# Patient Record
Sex: Female | Born: 1950 | Race: White | Hispanic: No | Smoking: Current every day smoker
Health system: Southern US, Community
[De-identification: ages and names within clinical notes are randomized; demographics above are authoritative.]

## PROBLEM LIST (undated history)

## (undated) DIAGNOSIS — F0280 Dementia in other diseases classified elsewhere without behavioral disturbance: Secondary | ICD-10-CM

## (undated) DIAGNOSIS — G3183 Dementia with Lewy bodies: Secondary | ICD-10-CM

## (undated) DIAGNOSIS — F32A Depression, unspecified: Secondary | ICD-10-CM

## (undated) DIAGNOSIS — K219 Gastro-esophageal reflux disease without esophagitis: Secondary | ICD-10-CM

## (undated) DIAGNOSIS — I517 Cardiomegaly: Secondary | ICD-10-CM

## (undated) DIAGNOSIS — J449 Chronic obstructive pulmonary disease, unspecified: Secondary | ICD-10-CM

## (undated) DIAGNOSIS — F329 Major depressive disorder, single episode, unspecified: Secondary | ICD-10-CM

## (undated) DIAGNOSIS — F319 Bipolar disorder, unspecified: Secondary | ICD-10-CM

## (undated) DIAGNOSIS — F419 Anxiety disorder, unspecified: Secondary | ICD-10-CM

## (undated) DIAGNOSIS — D696 Thrombocytopenia, unspecified: Secondary | ICD-10-CM

## (undated) DIAGNOSIS — M797 Fibromyalgia: Secondary | ICD-10-CM

## (undated) DIAGNOSIS — D649 Anemia, unspecified: Secondary | ICD-10-CM

## (undated) DIAGNOSIS — E039 Hypothyroidism, unspecified: Secondary | ICD-10-CM

## (undated) DIAGNOSIS — D509 Iron deficiency anemia, unspecified: Secondary | ICD-10-CM

## (undated) DIAGNOSIS — I509 Heart failure, unspecified: Secondary | ICD-10-CM

## (undated) HISTORY — DX: Thrombocytopenia, unspecified: D69.6

## (undated) HISTORY — DX: Iron deficiency anemia, unspecified: D50.9

## (undated) HISTORY — DX: Anxiety disorder, unspecified: F41.9

## (undated) HISTORY — DX: Bipolar disorder, unspecified: F31.9

## (undated) HISTORY — DX: Cardiomegaly: I51.7

## (undated) HISTORY — PX: CHOLECYSTECTOMY: SHX55

## (undated) HISTORY — DX: Hypothyroidism, unspecified: E03.9

## (undated) HISTORY — PX: EXPLORATORY LAPAROTOMY: SUR591

## (undated) HISTORY — DX: Gastro-esophageal reflux disease without esophagitis: K21.9

## (undated) HISTORY — PX: ABDOMINAL HYSTERECTOMY: SHX81

---

## 2001-09-18 ENCOUNTER — Emergency Department (HOSPITAL_COMMUNITY): Admission: EM | Admit: 2001-09-18 | Discharge: 2001-09-18 | Payer: Self-pay | Admitting: *Deleted

## 2001-09-18 ENCOUNTER — Encounter: Payer: Self-pay | Admitting: *Deleted

## 2002-05-28 ENCOUNTER — Emergency Department (HOSPITAL_COMMUNITY): Admission: EM | Admit: 2002-05-28 | Discharge: 2002-05-28 | Payer: Self-pay | Admitting: Internal Medicine

## 2002-05-28 ENCOUNTER — Encounter: Payer: Self-pay | Admitting: Internal Medicine

## 2003-04-23 ENCOUNTER — Emergency Department (HOSPITAL_COMMUNITY): Admission: EM | Admit: 2003-04-23 | Discharge: 2003-04-23 | Payer: Self-pay | Admitting: Internal Medicine

## 2004-02-28 ENCOUNTER — Other Ambulatory Visit: Payer: Self-pay

## 2004-10-13 ENCOUNTER — Emergency Department (HOSPITAL_COMMUNITY): Admission: EM | Admit: 2004-10-13 | Discharge: 2004-10-13 | Payer: Self-pay | Admitting: Emergency Medicine

## 2005-01-27 ENCOUNTER — Ambulatory Visit (HOSPITAL_COMMUNITY): Admission: RE | Admit: 2005-01-27 | Discharge: 2005-01-27 | Payer: Self-pay | Admitting: Preventative Medicine

## 2006-06-08 ENCOUNTER — Ambulatory Visit: Payer: Self-pay | Admitting: Gastroenterology

## 2006-07-07 ENCOUNTER — Ambulatory Visit: Payer: Self-pay | Admitting: Internal Medicine

## 2007-06-28 ENCOUNTER — Ambulatory Visit (HOSPITAL_COMMUNITY): Admission: RE | Admit: 2007-06-28 | Discharge: 2007-06-28 | Payer: Self-pay | Admitting: Family Medicine

## 2007-11-02 ENCOUNTER — Ambulatory Visit (HOSPITAL_COMMUNITY): Admission: RE | Admit: 2007-11-02 | Discharge: 2007-11-02 | Payer: Self-pay | Admitting: Family Medicine

## 2007-11-12 ENCOUNTER — Ambulatory Visit: Payer: Self-pay | Admitting: Cardiology

## 2007-11-12 ENCOUNTER — Ambulatory Visit (HOSPITAL_COMMUNITY): Admission: RE | Admit: 2007-11-12 | Discharge: 2007-11-12 | Payer: Self-pay | Admitting: Family Medicine

## 2007-11-29 ENCOUNTER — Ambulatory Visit (HOSPITAL_COMMUNITY): Admission: RE | Admit: 2007-11-29 | Discharge: 2007-11-29 | Payer: Self-pay | Admitting: Pulmonary Disease

## 2007-12-03 ENCOUNTER — Emergency Department (HOSPITAL_COMMUNITY): Admission: EM | Admit: 2007-12-03 | Discharge: 2007-12-03 | Payer: Self-pay | Admitting: Emergency Medicine

## 2007-12-08 ENCOUNTER — Ambulatory Visit (HOSPITAL_COMMUNITY): Admission: RE | Admit: 2007-12-08 | Discharge: 2007-12-08 | Payer: Self-pay | Admitting: Pulmonary Disease

## 2007-12-20 ENCOUNTER — Ambulatory Visit (HOSPITAL_COMMUNITY): Admission: RE | Admit: 2007-12-20 | Discharge: 2007-12-20 | Payer: Self-pay | Admitting: Family Medicine

## 2008-05-05 ENCOUNTER — Ambulatory Visit (HOSPITAL_COMMUNITY): Admission: RE | Admit: 2008-05-05 | Discharge: 2008-05-05 | Payer: Self-pay | Admitting: Pulmonary Disease

## 2008-07-03 ENCOUNTER — Ambulatory Visit (HOSPITAL_COMMUNITY): Admission: RE | Admit: 2008-07-03 | Discharge: 2008-07-03 | Payer: Self-pay | Admitting: Family Medicine

## 2008-12-28 ENCOUNTER — Observation Stay (HOSPITAL_COMMUNITY): Admission: EM | Admit: 2008-12-28 | Discharge: 2008-12-30 | Payer: Self-pay | Admitting: Emergency Medicine

## 2008-12-29 ENCOUNTER — Encounter (INDEPENDENT_AMBULATORY_CARE_PROVIDER_SITE_OTHER): Payer: Self-pay | Admitting: Cardiology

## 2008-12-30 HISTORY — PX: CARDIAC CATHETERIZATION: SHX172

## 2009-03-17 ENCOUNTER — Encounter: Payer: Self-pay | Admitting: Orthopedic Surgery

## 2009-03-17 ENCOUNTER — Ambulatory Visit (HOSPITAL_COMMUNITY): Admission: RE | Admit: 2009-03-17 | Discharge: 2009-03-17 | Payer: Self-pay | Admitting: Emergency Medicine

## 2009-05-02 ENCOUNTER — Ambulatory Visit (HOSPITAL_COMMUNITY): Admission: RE | Admit: 2009-05-02 | Discharge: 2009-05-02 | Payer: Self-pay | Admitting: Pulmonary Disease

## 2009-07-04 ENCOUNTER — Ambulatory Visit: Payer: Self-pay | Admitting: Orthopedic Surgery

## 2009-07-04 DIAGNOSIS — Z8679 Personal history of other diseases of the circulatory system: Secondary | ICD-10-CM | POA: Insufficient documentation

## 2009-07-04 DIAGNOSIS — M1710 Unilateral primary osteoarthritis, unspecified knee: Secondary | ICD-10-CM

## 2009-07-04 DIAGNOSIS — M25569 Pain in unspecified knee: Secondary | ICD-10-CM

## 2009-07-25 ENCOUNTER — Ambulatory Visit (HOSPITAL_COMMUNITY): Admission: RE | Admit: 2009-07-25 | Discharge: 2009-07-25 | Payer: Self-pay | Admitting: Pulmonary Disease

## 2009-10-22 ENCOUNTER — Emergency Department (HOSPITAL_COMMUNITY): Admission: EM | Admit: 2009-10-22 | Discharge: 2009-10-22 | Payer: Self-pay | Admitting: Emergency Medicine

## 2009-10-29 ENCOUNTER — Ambulatory Visit (HOSPITAL_COMMUNITY): Admission: RE | Admit: 2009-10-29 | Discharge: 2009-10-29 | Payer: Self-pay | Admitting: Family Medicine

## 2010-01-01 ENCOUNTER — Emergency Department (HOSPITAL_COMMUNITY): Admission: EM | Admit: 2010-01-01 | Discharge: 2010-01-01 | Payer: Self-pay | Admitting: Emergency Medicine

## 2010-06-05 ENCOUNTER — Encounter (HOSPITAL_COMMUNITY): Admission: RE | Admit: 2010-06-05 | Discharge: 2010-07-05 | Payer: Self-pay | Admitting: Oncology

## 2010-06-05 ENCOUNTER — Ambulatory Visit (HOSPITAL_COMMUNITY): Payer: Self-pay | Admitting: Oncology

## 2010-07-24 ENCOUNTER — Ambulatory Visit (HOSPITAL_COMMUNITY): Payer: Self-pay | Admitting: Oncology

## 2010-12-01 ENCOUNTER — Encounter: Payer: Self-pay | Admitting: Family Medicine

## 2010-12-02 ENCOUNTER — Encounter: Payer: Self-pay | Admitting: Pulmonary Disease

## 2011-01-21 ENCOUNTER — Other Ambulatory Visit (HOSPITAL_COMMUNITY): Payer: Self-pay

## 2011-01-21 ENCOUNTER — Encounter (HOSPITAL_COMMUNITY): Payer: Self-pay

## 2011-01-24 ENCOUNTER — Ambulatory Visit (HOSPITAL_COMMUNITY): Payer: Self-pay | Admitting: Oncology

## 2011-01-25 LAB — DIFFERENTIAL
Basophils Relative: 1 % (ref 0–1)
Eosinophils Absolute: 0.2 10*3/uL (ref 0.0–0.7)
Eosinophils Relative: 3 % (ref 0–5)
Lymphocytes Relative: 28 % (ref 12–46)
Lymphs Abs: 1.7 10*3/uL (ref 0.7–4.0)
Monocytes Absolute: 0.5 10*3/uL (ref 0.1–1.0)
Monocytes Relative: 8 % (ref 3–12)

## 2011-01-25 LAB — CBC
MCV: 89.2 fL (ref 78.0–100.0)
Platelets: 175 10*3/uL (ref 150–400)
RBC: 3.95 MIL/uL (ref 3.87–5.11)
RDW: 13 % (ref 11.5–15.5)
WBC: 6.2 10*3/uL (ref 4.0–10.5)

## 2011-02-11 LAB — COMPREHENSIVE METABOLIC PANEL
ALT: 20 U/L (ref 0–35)
Alkaline Phosphatase: 86 U/L (ref 39–117)
BUN: 18 mg/dL (ref 6–23)
CO2: 26 mEq/L (ref 19–32)
Calcium: 9 mg/dL (ref 8.4–10.5)
GFR calc non Af Amer: >60 mL/min (ref >60)
Glucose, Bld: 89 mg/dL (ref 70–99)
Potassium: 4.1 mEq/L (ref 3.5–5.1)
Sodium: 139 mEq/L (ref 135–145)

## 2011-02-11 LAB — DIFFERENTIAL
Basophils Relative: 1 % (ref 0–1)
Eosinophils Absolute: 0.1 10*3/uL (ref 0.0–0.7)
Neutro Abs: 4.5 10*3/uL (ref 1.7–7.7)
Neutrophils Relative %: 71 % (ref 43–77)

## 2011-02-11 LAB — URINALYSIS, ROUTINE W REFLEX MICROSCOPIC
Bilirubin Urine: NEGATIVE
Hgb urine dipstick: NEGATIVE
Nitrite: NEGATIVE
Protein, ur: NEGATIVE mg/dL
Specific Gravity, Urine: 1.01 (ref 1.005–1.030)
Urobilinogen, UA: 0.2 mg/dL (ref 0.0–1.0)

## 2011-02-11 LAB — CBC
HCT: 36.1 % (ref 36.0–46.0)
Hemoglobin: 12.3 g/dL (ref 12.0–15.0)
MCHC: 34.2 g/dL (ref 30.0–36.0)
MCV: 91.9 fL (ref 78.0–100.0)
Platelets: 184 10*3/uL (ref 150–400)
RBC: 3.93 MIL/uL (ref 3.87–5.11)
RDW: 13 % (ref 11.5–15.5)
WBC: 6.4 10*3/uL (ref 4.0–10.5)

## 2011-02-11 LAB — LIPASE, BLOOD: Lipase: 15 U/L (ref 11–59)

## 2011-02-14 LAB — BLOOD GAS, ARTERIAL
FIO2: 21 %
O2 Content: 21 L/min
O2 Saturation: 97.8 %

## 2011-02-25 LAB — LIPID PANEL
Cholesterol: 189 mg/dL (ref 0–200)
LDL Cholesterol: 134 mg/dL — ABNORMAL HIGH (ref 0–99)

## 2011-02-25 LAB — CARDIAC PANEL(CRET KIN+CKTOT+MB+TROPI)
CK, MB: 1.1 ng/mL (ref 0.3–4.0)
Relative Index: INVALID (ref 0.0–2.5)
Troponin I: <0.01 ng/mL (ref 0.00–0.06)

## 2011-02-25 LAB — COMPREHENSIVE METABOLIC PANEL
ALT: 31 U/L (ref 0–35)
AST: 23 U/L (ref 0–37)
Albumin: 3.8 g/dL (ref 3.5–5.2)
Chloride: 99 mEq/L (ref 96–112)
Creatinine, Ser: 0.67 mg/dL (ref 0.4–1.2)
GFR calc Af Amer: >60 mL/min (ref >60)
Potassium: 4 mEq/L (ref 3.5–5.1)
Sodium: 133 mEq/L — ABNORMAL LOW (ref 135–145)
Total Bilirubin: 0.4 mg/dL (ref 0.3–1.2)

## 2011-02-25 LAB — DIFFERENTIAL
Basophils Absolute: 0 10*3/uL (ref 0.0–0.1)
Eosinophils Relative: 0 % (ref 0–5)
Lymphocytes Relative: 16 % (ref 12–46)
Lymphs Abs: 1.4 10*3/uL (ref 0.7–4.0)
Monocytes Absolute: 0.3 10*3/uL (ref 0.1–1.0)

## 2011-02-25 LAB — CBC
HCT: 37.3 % (ref 36.0–46.0)
HCT: 38.9 % (ref 36.0–46.0)
Hemoglobin: 13.8 g/dL (ref 12.0–15.0)
MCV: 92.2 fL (ref 78.0–100.0)
Platelets: 171 10*3/uL (ref 150–400)
Platelets: 173 10*3/uL (ref 150–400)
RBC: 4.31 MIL/uL (ref 3.87–5.11)
RDW: 13.5 % (ref 11.5–15.5)
WBC: 7.6 10*3/uL (ref 4.0–10.5)
WBC: 8.8 10*3/uL (ref 4.0–10.5)

## 2011-02-25 LAB — BASIC METABOLIC PANEL
BUN: 14 mg/dL (ref 6–23)
CO2: 30 mEq/L (ref 19–32)
Chloride: 102 mEq/L (ref 96–112)
Chloride: 103 mEq/L (ref 96–112)
GFR calc Af Amer: >60 mL/min (ref >60)
Glucose, Bld: 106 mg/dL — ABNORMAL HIGH (ref 70–99)
Potassium: 4.3 mEq/L (ref 3.5–5.1)
Sodium: 140 mEq/L (ref 135–145)

## 2011-02-25 LAB — PROTIME-INR: INR: 1 (ref 0.00–1.49)

## 2011-02-25 LAB — ANA: Anti Nuclear Antibody(ANA): NEGATIVE

## 2011-02-25 LAB — RAPID URINE DRUG SCREEN, HOSP PERFORMED: Barbiturates: NOT DETECTED

## 2011-02-25 LAB — PTH, INTACT AND CALCIUM: PTH: 35.7 pg/mL (ref 14.0–72.0)

## 2011-02-25 LAB — CK TOTAL AND CKMB (NOT AT ARMC): CK, MB: 0.9 ng/mL (ref 0.3–4.0)

## 2011-02-25 LAB — APTT: aPTT: 31 seconds (ref 24–37)

## 2011-03-06 ENCOUNTER — Encounter (HOSPITAL_COMMUNITY): Payer: Self-pay | Attending: Oncology

## 2011-03-06 ENCOUNTER — Other Ambulatory Visit (HOSPITAL_COMMUNITY): Payer: Self-pay | Admitting: Oncology

## 2011-03-06 DIAGNOSIS — Z79899 Other long term (current) drug therapy: Secondary | ICD-10-CM | POA: Insufficient documentation

## 2011-03-06 DIAGNOSIS — D649 Anemia, unspecified: Secondary | ICD-10-CM

## 2011-03-06 DIAGNOSIS — I10 Essential (primary) hypertension: Secondary | ICD-10-CM | POA: Insufficient documentation

## 2011-03-06 DIAGNOSIS — D696 Thrombocytopenia, unspecified: Secondary | ICD-10-CM | POA: Insufficient documentation

## 2011-03-06 DIAGNOSIS — IMO0001 Reserved for inherently not codable concepts without codable children: Secondary | ICD-10-CM | POA: Insufficient documentation

## 2011-03-17 ENCOUNTER — Other Ambulatory Visit (HOSPITAL_COMMUNITY): Payer: Self-pay

## 2011-03-18 ENCOUNTER — Other Ambulatory Visit (HOSPITAL_COMMUNITY): Payer: Self-pay | Admitting: Oncology

## 2011-03-18 ENCOUNTER — Encounter (HOSPITAL_COMMUNITY): Payer: BC Managed Care – PPO | Attending: Oncology

## 2011-03-18 DIAGNOSIS — Z79899 Other long term (current) drug therapy: Secondary | ICD-10-CM | POA: Insufficient documentation

## 2011-03-18 DIAGNOSIS — I10 Essential (primary) hypertension: Secondary | ICD-10-CM | POA: Insufficient documentation

## 2011-03-18 DIAGNOSIS — D696 Thrombocytopenia, unspecified: Secondary | ICD-10-CM | POA: Insufficient documentation

## 2011-03-18 LAB — URINALYSIS, MICROSCOPIC ONLY
Glucose, UA: NEGATIVE mg/dL
Ketones, ur: NEGATIVE mg/dL
Leukocytes, UA: NEGATIVE
Specific Gravity, Urine: 1.025 (ref 1.005–1.030)
pH: 6.5 (ref 5.0–8.0)

## 2011-03-25 NOTE — Procedures (Signed)
NAME:  Lacey Gonzales, Lacey Gonzales              ACCOUNT NO.:  [id number]   MEDICAL RECORD NO.:  [id number]          PATIENT TYPE:  OUT   LOCATION:  RESP                          FACILITY:  [facility]   PHYSICIAN:  Edward L. Juanetta Gosling, M.D.DATE OF BIRTH:  12-05-1950   DATE OF PROCEDURE:  DATE OF DISCHARGE:  11/29/2007                            PULMONARY FUNCTION TEST   IMPRESSION:  1. Spirometry shows airflow obstruction at the level of the smaller      airways, but no ventilatory defect.  2. Lung volumes are normal.  3. DLCO was mildly reduced.  4. Arterial blood gases are normal.      Edward L. Juanetta Gosling, M.D.  Electronically Signed     ELH/MEDQ  D:  12/01/2007  T:  12/01/2007  Job:  [id number]

## 2011-03-25 NOTE — Procedures (Signed)
NAME:  GERA, INBODEN              ACCOUNT NO.:  [id number]   MEDICAL RECORD NO.:  [id number]          PATIENT TYPE:  OUT   LOCATION:  RAD                           FACILITY:  [facility]   PHYSICIAN:  Gerrit Friends. Dietrich Pates, MD, FACCDATE OF BIRTH:  May 25, 1951   DATE OF PROCEDURE:  11/12/2007  DATE OF DISCHARGE:                                ECHOCARDIOGRAM   REFERRING:  Assunta Found, MD   CLINICAL DATA:  A 60 year old woman with cardiomegaly and murmur;  history of hypertension.  M-mode:  Aorta 2.5, left atrium 3.6, septum  0.9, posterior wall 0.9, LV diastole 4.8, LV systole 3.7.   1. Technically adequate echocardiographic study.  2. Normal left atrium, right atrium and right ventricle.  3. Normal aortic valve and proximal ascending aorta.  4. Normal pulmonic valve and proximal pulmonary artery.  5. Normal tricuspid valve.  6. Delicate mitral valve with flat coaptation but no prolapse and no      regurgitation.  7. Normal internal dimension, wall thickness, regional and global      function of the left ventricle.  8. Normal IVC.      Gerrit Friends. Dietrich Pates, MD, [facility]  Electronically Signed     RMR/MEDQ  D:  11/12/2007  T:  11/12/2007  Job:  [id number]

## 2011-03-25 NOTE — Discharge Summary (Signed)
NAME:  Lacey Gonzales, Lacey Gonzales              ACCOUNT NO.:  [id number]   MEDICAL RECORD NO.:  [id number]          PATIENT TYPE:  INP   LOCATION:  3706                         FACILITY:  [facility]   PHYSICIAN:  Sheliah Mends, MD      DATE OF BIRTH:  November 02, 1951   DATE OF ADMISSION:  12/28/2008  DATE OF DISCHARGE:  12/30/2008                               DISCHARGE SUMMARY   DISCHARGE DIAGNOSES:  1. Near syncope, altered speech, unsteady gait, and dizziness,      possible effects from narcotic medications.  2. Acute-on-chronic sinusitis and mastoiditis.  3. Non-insulin-dependent diabetes mellitus.  4. Hypertension.  5. Gastroesophageal reflux disease.  6. Depression.  7. History of migraine headaches.  8. History of fibromyalgia.  9. History of osteoarthritis.  10.History of asthma.  11.Chest pain, noncardiac ischemic related, status post cardiac      catheterization, possible gastroesophageal reflux disease or      gastrointestinal related or musculoskeletal.   DISCHARGE MEDICATIONS:  1. Glipizide 10 mg daily.  2. Aspirin 325 mg daily.  3. Lisinopril 10 mg daily.  4. Nexium 40 mg twice a day.  5. Xanax 0.5 mg as needed p.r.n.  6. Ambien 12.5 mg at night.  7. Nasonex as needed.  8. Vicodin as needed.  9. Coreg 6.25 mg twice per day.  10.Amoxicillin 500 mg 2 times per day x7 days.  11.Lasix 20 mg daily, use only for swelling when needed.   DISCHARGE INSTRUCTIONS:  She is to do no strenuous activity, lifting,  pushing, pulling, or exercise for a week.  She was given a note not to  return to work until January 08, 2009.  If she has any problems with her  groin, she can give our office a call [phone].  She should follow up  with her primary care doctor.   LABORATORY DATA:  Total cholesterol 189, triglycerides 90, HDL 37, and  LDL 134.  Hemoglobin 13, hematocrit 37.3, WBC 7.6, and platelets 171.  ANA was negative.  Sodium 139, potassium 4.3, BUN 14, and creatinine  0.85.  BNP was less than 30.   TSH was 1.271.  UDS was positive for  benzodiazepines and positive for opiates.  CK-MBs and troponins were  negative x3.  PTH labs pending.   X-ray, MRA of her head with and without contrast revealed no acute  abnormality, chronic sinusitis and mastoiditis.  Vertebral arteries were  patent.  The internal carotid artery was patent.  No hemorrhage or mass  lesion and no acute infarct.  CT of her head showed no acute  intracranial abnormality, right mastoid effusion, and calvarial  osteopenia, questionable metabolic bone disease such as  hyperparathyroidism.   HOSPITAL COURSE:  Lacey Gonzales was seen in our [place] office on  December 28, 2008, by Dr. Sheliah Mends.  She had been sent over from  her primary care physician to office of Dr. Assunta Found with complaints  of chest pain, lightheadedness, near syncope, and dizziness.  She also  complained about slurred speech and facial droop.  She was advised to  come to [facility].  She  did not want to go by ambulance.  She  went home first and had someone bring her to the emergency room later on  in the evening.  She underwent assessment for CVA which was negative.  She was noted to have chronic sinusitis.  She was put on antibiotics for  mastoiditis.  PTH level was also drawn because of questionable metabolic  bone disease.  The test came back negative for any stroke, mass, or  lesions.  It was decided that she should undergo cardiac catheterization  because of her chest pain.  She had normal coronaries, normal LV  function, no MR.  She had normal aortoiliac runoff.  On December 30, 2008, she was considered stable for discharge home.  Medications were  adjusted while she was in the hospital.  She should follow up with her  primary care doctor for further treatment. She may require adjustment of  pain and anxiolytic medications which may have contributed to her  symptoms.       Lezlie Octave, N.P.      Sheliah Mends, MD   Electronically Signed    BB/MEDQ  D:  12/30/2008  T:  12/31/2008  Job:  [id number]   cc:   Corrie Mckusick, M.D.

## 2011-03-25 NOTE — Cardiovascular Report (Signed)
NAME:  Lacey Gonzales, STELLA NO.:  [id number]   MEDICAL RECORD NO.:  [id number]          PATIENT TYPE:  INP   LOCATION:  3706                         FACILITY:  [facility]   PHYSICIAN:  Sheliah Mends, MD      DATE OF BIRTH:  January 09, 1951   DATE OF PROCEDURE:  DATE OF DISCHARGE:                            CARDIAC CATHETERIZATION   CARDIOLOGISTS:  Sheliah Mends, MD and Nicki Guadalajara, MD   INDICATIONS:  Ms. Arrellano is a 60 year old lady who presented to  [facility] with multiple complaints  including shortness of breath, exertional chest pain, palpitations, and  significant lower extremity edema.  In addition, she reported  neurological symptoms including an unsteady gait, dizziness,  lightheadedness, near syncope, and slurred speech.  She was initially  evaluated with an MRI/MRA of head and neck without evidence of a prior  neurological event.  In addition, a CT of her head was negative for  evidence of CVA.  Subsequently, she was brought to the cardiac  catheterization laboratory for further evaluation of her coronary  status.  Ms. Bump has multiple risk factors including hypertension,  tobacco dependence, and obesity.   PROCEDURE:  After informed consent was obtained, the patient was started  on conscious sedation using Versed and fentanyl.  In addition, local  anesthesia was applied using 1% lidocaine.  In sterile fashion, using  the modified Seldinger technique, access to the right femoral artery was  obtained and a 5-French arterial sheath was placed.  Subsequently,  selective right and left coronary angiography, left ventriculography,  and aortic runoff was performed using a Judkins 5-French left #4 and  right #4 catheter as well as a pigtail catheter.   FINDINGS:  1. Left main coronary artery.  The left main coronary artery is a      short vessel that trifurcates into the LAD, ramus intermedius, and      left circumflex artery.  The left  main artery is without      angiographically significant disease.  2. Left descending coronary artery disease.  The LAD is a large vessel      wrapping around the apex.  It gives off a large, branching diagonal      artery.  The LAD has no angiographically significant disease.  3. Ramus intermedius.  The ramus intermedius is a large vessel without      angiographically significant disease.  4. Left circumflex artery.  The left circumflex artery is a medium-      sized vessel which gives 1 obtuse marginal branch off.  The left      circumflex artery is without significant disease.  5. Right coronary artery.  The right coronary artery is a medium-sized      vessel which is dominant.  It gives off the PDA.  There is no      hemodynamically significant disease seen in the RCA.  6. The distal aortic runoff showed normal renal arteries, normal      caliber, and no evidence of significant disease in the iliac      arteries.   HEMODYNAMICS:  Left ventricular pressure 159/15 mmHg.  Aortic pressure  165/84 mmHg.  There is no significant aortic gradient measured.   Left ventriculogram.  The left ventriculogram shows normal left  ventricular function.  There is no evidence of significant mitral  regurgitation.   SUMMARY:  1. Normal coronary arteries without evidence of angiographically      significant coronary artery disease.  2. Normal left ventricular function.  3. No evidence of a significant aortic gradient.  4. Normal aortic runoff without evidence of peripheral vascular      disease.   RECOMMENDATIONS:  Ms. Streiff's chest pain is of noncardiac nature.  She  has no evidence of significant coronary artery disease.  She was quite  hypertensive during her cardiac catheterization and should be followed  with appropriate risk factor management including smoking cessation and  treatment of her hypertension.   The procedure was completed without complication.  Dr. Nicki Guadalajara  proctored  the entire case.      Sheliah Mends, MD  Electronically Signed     JE/MEDQ  D:  12/29/2008  T:  12/30/2008  Job:  [id number]

## 2011-03-25 NOTE — H&P (Signed)
NAME:  Lacey Gonzales, Lacey Gonzales NO.:  [id number]   MEDICAL RECORD NO.:  [id number]          PATIENT TYPE:  INP   LOCATION:  3706                         FACILITY:  [facility]   PHYSICIAN:  Sheliah Mends, MD      DATE OF BIRTH:  July 24, 1951   DATE OF ADMISSION:  12/28/2008  DATE OF DISCHARGE:                              HISTORY & PHYSICAL   IDENTIFICATION:  A 60 year old lady with slurred speech, facial droop,  chest pain, and bilateral edema.   HISTORY OF PRESENT ILLNESS:  Ms. Lacey Gonzales is a 60 year old lady  who was referred by Dr. Assunta Found for a cardiac evaluation secondary  to lower extremity edema and chest pain. Ms. Lacey Gonzales describes  significant decline of her functional status over the last 2 weeks.  She  had multiple episodes of lightheadedness, dizziness, and near-syncope.  In fact, she fell down twice at her place of employment.  In addition,  she noted a significant weight gain and lower extremities edema.  She  has difficulties to sit and to put her shoes on.  Ms. Lacey Gonzales further  describes episodes of chest pain and palpitation associated with nausea  and shortness of breath.  Furthermore, she describes episodes of slurred  speech.  In fact, she was told by several people that they were unable  to understand her.  On further questioning, Ms. Lacey Gonzales admits to mild  right facial droop.  She denies any focal neurological deficits  elsewhere.  The onset of his symptoms was fairly sudden, sometime 2  weeks ago, and has progressively worsened.   Ms. Lacey Gonzales has no history of coronary artery disease or prior stroke.  Her risk factors for coronary artery disease include lifelong history of  tobacco use.  Her lipid profile is unknown.  She has no history of type  2 diabetes mellitus.  Ms. Lacey Gonzales denies any illicit drug use, any recent  fevers, productive cough, diarrhea, or constipation.  She did notice  some skin changes with marble discoloration of her skin on legs  and  arms.  Her medical history is further complicated by history of migraine  headaches, asthma, fibromyalgia, and osteoarthritis.   PAST MEDICAL HISTORY:  1. Hypertension.  2. GERD.  3. Depression.  4. Migraine headaches.  5. Asthma.  6. Fibromyalgia.  7. Osteoarthritis.   MEDICATIONS:  1. Nexium 40 mg p.o. daily.  2. Alprazolam 0.5 mg p.r.n.  3. Ambien CR 12.5 mg nightly.  4. Lasix 20 mg p.o. daily  5. Unknown nebulizers.  6. Hydrocodone p.r.n.   ALLERGIES:  SULFA.   SOCIAL HISTORY:  The patient is married and has 2 children.  She works  for a [employer] and is exposed to chemicals everyday.  She is unable  to remember any particular agents.  She has a 40-pack year history of  tobacco use.  She denies a history of alcohol and IV drug abuse.   FAMILY HISTORY:  The patient's mother is alive at age 35 and has a  history of hypertension.  The patient's father died of cancer at an  unknown age.  The  patient's brother died at age 66 of complications from  myocardial infarction.  The patient has 5 more siblings and all of them  have a history of hypertension.   REVIEW OF SYSTEMS:  Positive as above, otherwise negative.   PHYSICAL EXAMINATION:  GENERAL:  The patient is alert and oriented x3.  VITAL SIGNS:  Blood pressure 110/72, heart rate 104, weight 173.  NECK:  Supple.  Normal JVP.  No carotid bruit.  CHEST:  Lungs clear to auscultation bilaterally.  No rales or wheezes.  HEART:  Regular rate and rhythm.  No rub, murmur, or gallop.  ABDOMEN:  Soft, nontender, and nondistended.  Positive bowel sounds.  EXTREMITIES:  A 2+ edema on hands and feet.   EKG shows sinus tachycardia at a rate of 104 beats per minute, normal  axis, and no significant ST-T segment changes.   CBC and chemistry panel is within normal limits.  These were obtained on  December 22, 2008. A TSH is within normal limits.   IMPRESSION:  1. Chest pain of unknown etiology with multiple risk factors for       coronary artery disease.  2. Slurred speech and symptoms concerning for a cerebrovascular      accident.  3. Hypertension.  4. Tobacco dependence.  5. Asthma.  6. Unknown lipid status.   PLAN:  Lacey Gonzales presents with slurred speech and unsteady gait and  possibly with mild right facial droop, all concerning for a recent CVA  or related neurological process.  She will be admitted to [facility], and an MRI, MRA of head and neck will be obtained.  This  should give more insight into recent CVA as well as other neurological  disorders, such as, multiple sclerosis.  In addition, Lacey Gonzales will be  scheduled for a transthoracic echocardiogram to evaluate her left  ventricular function and for valve abnormalities.  She will be followed  with serial cardiac enzymes given her complaints of chest pain.  Given  her strong family history of coronary artery disease and complaints of  recurrent chest pain, she may be scheduled for cardiac catheterization  after a Neurology evaluation has been completed.  In addition, Ms.  Gonzales will be evaluated for presence of an autoimmune disorder giving  her the systemic character of her symptoms.   Lacey Gonzales was initially seen in the [place] office of the  [facility].  I asked Lacey Gonzales to agree to  transport via the EMS; however, she declined and against my advice drove  in her own car home.  She stated that she will meet her husband at home  who will transport her to [facility].   The patient will be admitted to telemetry as a full code.      Sheliah Mends, MD  Electronically Signed     JE/MEDQ  D:  12/28/2008  T:  12/29/2008  Job:  [id number]   cc:   Corrie Mckusick, M.D.

## 2011-04-17 ENCOUNTER — Other Ambulatory Visit (HOSPITAL_COMMUNITY): Payer: Self-pay | Admitting: Endocrinology

## 2011-04-17 DIAGNOSIS — E0500 Thyrotoxicosis with diffuse goiter without thyrotoxic crisis or storm: Secondary | ICD-10-CM

## 2011-04-22 ENCOUNTER — Encounter (HOSPITAL_COMMUNITY): Payer: Self-pay

## 2011-04-22 ENCOUNTER — Encounter (HOSPITAL_COMMUNITY)
Admission: RE | Admit: 2011-04-22 | Discharge: 2011-04-22 | Disposition: A | Discharge status: Home / Self Care | Payer: PRIVATE HEALTH INSURANCE | Source: Ambulatory Visit | Attending: Endocrinology | Admitting: Endocrinology

## 2011-04-22 DIAGNOSIS — E0500 Thyrotoxicosis with diffuse goiter without thyrotoxic crisis or storm: Secondary | ICD-10-CM | POA: Insufficient documentation

## 2011-04-22 HISTORY — DX: Heart failure, unspecified: I50.9

## 2011-04-22 MED ORDER — SODIUM IODIDE I 131 CAPSULE
10.0000 | Freq: Once | INTRAVENOUS | Status: AC | PRN
  Administered 2011-04-22: 15 via ORAL

## 2011-04-23 ENCOUNTER — Inpatient Hospital Stay (HOSPITAL_COMMUNITY): Admission: RE | Admit: 2011-04-23 | Payer: BC Managed Care – PPO | Source: Ambulatory Visit

## 2011-04-23 MED ORDER — SODIUM PERTECHNETATE TC 99M INJECTION: 10.0000 | Freq: Once | INTRAVENOUS | Status: AC | PRN

## 2011-04-30 ENCOUNTER — Ambulatory Visit (INDEPENDENT_AMBULATORY_CARE_PROVIDER_SITE_OTHER): Payer: Self-pay | Admitting: Internal Medicine

## 2011-04-30 DIAGNOSIS — Z86010 Personal history of colonic polyps: Secondary | ICD-10-CM

## 2011-04-30 DIAGNOSIS — D649 Anemia, unspecified: Secondary | ICD-10-CM

## 2011-04-30 DIAGNOSIS — Z800 Family history of malignant neoplasm of digestive organs: Secondary | ICD-10-CM

## 2011-05-07 ENCOUNTER — Ambulatory Visit (HOSPITAL_COMMUNITY): Payer: Self-pay | Admitting: Oncology

## 2011-05-08 ENCOUNTER — Other Ambulatory Visit (HOSPITAL_COMMUNITY): Payer: Self-pay | Admitting: Endocrinology

## 2011-05-08 DIAGNOSIS — E0500 Thyrotoxicosis with diffuse goiter without thyrotoxic crisis or storm: Secondary | ICD-10-CM

## 2011-05-12 ENCOUNTER — Encounter (HOSPITAL_COMMUNITY): Payer: Medicare Other | Attending: Oncology | Admitting: Oncology

## 2011-05-12 DIAGNOSIS — D649 Anemia, unspecified: Secondary | ICD-10-CM

## 2011-05-12 NOTE — Consult Note (Signed)
NAME:  Lacey Gonzales, Lacey Gonzales              ACCOUNT NO.:  [id number]  MEDICAL RECORD NO.:  [id number]  LOCATION:  RAD                           FACILITY:  [facility]  PHYSICIAN:  Lionel December, M.D.    DATE OF BIRTH:  Mar 10, 1951  DATE OF CONSULTATION:  04/30/2011 DATE OF DISCHARGE:                                  CONSULTATION   REASON FOR CONSULTATION:  Anemia and screening colonoscopy.  HISTORY OF PRESENT ILLNESS:  Lacey Gonzales is a 60 year old female referred to our office by Dr. Mariel Sleet for anemia.  Lacey Gonzales tells me she has a history of anemia for approximately 4 years.  On February 27, 2011, her H and H was 11.3 and 34.6, her MCV was 85.2, and platelets were slightly low at 127.  On September 05, 2010, her H and H was 12.2 and 37.8, her MCV was 94.8, and platelets were 209.  She tells me her appetite is good. She has had a weight loss of 19 pounds over the last 2 years.  She tells me she does have frequent constipation.  She usually has a bowel movement every 2 days, and she does use a stool softener on a p.r.n. basis.  Her stools are brown.  She denies any history of melena or bright red rectal bleeding.  Her last colonoscopy she tells me has been over 10 years ago.  She could not tell me where it was.  She says she was 60 years old and did have polyps.  She is allergic to SULFA.  PAST SURGICAL HISTORY:  She has had a cholecystectomy many years ago.  MEDICAL HISTORY:  Fibromyalgia for 4 years, manic depression x4 years, bipolar for 4 years, and she has a history of Parkinson's.  FAMILY HISTORY:  Her mother is in good health.  Her father is deceased from stomach cancer.  Two sisters, one has depression, one has coronary artery disease. She has   three brothers, one is deceased with a hx of colon cancer, but he actually committed suicide, and two are in good health.  She is widowed.  She is unemployed. She smokes one pack of cigarettes a day since age 87.  She has smoked up to 4 packs a day.   She does not drink or do drugs.  She has two children, one has diabetes type 1 and is bipolar and schizophrenic; and one has bipolar.  HOME MEDICATIONS: 1. Xanax 0.5 mg three times a day. 2. Hydrocodone 5/500 one a day. 3. Savella 50 mg a day. 4. Artane 2 mg three times a day. 5. Benzonatate 100 mg three times a day. 6. Meloxicam 15 mg a day. 7. Acid reducer 150 mg twice a day. 8. Coreg 25 mg twice a day. 9. Lasix 20 mg one a day. 10.Sertraline 50 mg a day. 11.Phenergan 25 mg as needed. 12.Ambien 10 mg at night. 13.Doxepin 6 mg a day. 14.Cough syrup p.r.n.  OBJECTIVE:  VITAL SIGNS:  Her weight is 161.5, her height is 5 feet, her temperature is 98, her blood pressure is 120/78, her pulse 80. HEENT:  She has natural teeth.  Her oral mucosa is moist.  Her conjunctivae are pink.  Her sclerae  are anicteric. NECK:  Her thyroid is normal.  There is no cervical lymphadenopathy. LUNGS:  Clear. HEART:  Regular rate and rhythm. ABDOMEN:  Soft.  Bowel sounds are positive.  No masses and no tenderness.  There is no edema to her lower extremities.  Her stool is guaiac negative.  ASSESSMENT:  Lacey Gonzales is a 60 year old female presenting with very mild anemia.  Her stool was guaiac negative. She also c/o frequent constipation. There is however a family history of colon cancer in a first-degree relative.  Her last colonoscopy was greater than 10 years ago.  Colonic neoplasm, arteriovenous malformation, and polyp needs to be ruled out.  RECOMMENDATIONS:  We will schedule a colonoscopy in the near future. The risk and benefits were reviewed with the patient, and she is agreeable.  I would also like for her to start MiraLax one-half to one scoop daily for her frequent constipation.    ______________________________ Dorene Ar, NP   ______________________________ Lionel December, M.D.    TS/MEDQ  D:  04/30/2011  T:  05/01/2011  Job:  [id number]  cc:   Ladona Horns. Mariel Sleet, MD Fax:  [phone]  Electronically Signed by Dorene Ar PA on 05/01/2011 04:58:06 PM Electronically Signed by Lionel December M.D. on 05/12/2011 12:31:21 AM

## 2011-05-21 ENCOUNTER — Encounter (HOSPITAL_COMMUNITY)
Admission: RE | Admit: 2011-05-21 | Discharge: 2011-05-21 | Disposition: A | Discharge status: Home / Self Care | Payer: Medicaid Other | Source: Ambulatory Visit | Attending: Endocrinology | Admitting: Endocrinology

## 2011-05-21 DIAGNOSIS — E0500 Thyrotoxicosis with diffuse goiter without thyrotoxic crisis or storm: Secondary | ICD-10-CM | POA: Insufficient documentation

## 2011-05-21 MED ORDER — SODIUM IODIDE I 131 CAPSULE
10.0000 | Freq: Once | INTRAVENOUS | Status: AC | PRN
  Administered 2011-05-21: 10 via ORAL

## 2011-06-09 MED ORDER — SODIUM CHLORIDE 0.45 % IV SOLN
Freq: Once | INTRAVENOUS | Status: AC
  Administered 2011-06-11: 1000 mL via INTRAVENOUS

## 2011-06-10 ENCOUNTER — Encounter (INDEPENDENT_AMBULATORY_CARE_PROVIDER_SITE_OTHER): Payer: Self-pay | Admitting: Internal Medicine

## 2011-06-10 MED ORDER — MIDAZOLAM HCL 5 MG/5ML IJ SOLN
INTRAMUSCULAR | Status: AC
  Filled 2011-06-10: qty 5

## 2011-06-10 MED ORDER — MEPERIDINE HCL 50 MG/ML IJ SOLN
INTRAMUSCULAR | Status: AC
  Filled 2011-06-10: qty 1

## 2011-06-11 ENCOUNTER — Encounter (HOSPITAL_COMMUNITY): Payer: Self-pay | Admitting: *Deleted

## 2011-06-11 ENCOUNTER — Ambulatory Visit (HOSPITAL_COMMUNITY)
Admission: RE | Admit: 2011-06-11 | Discharge: 2011-06-11 | Disposition: A | Discharge status: Home / Self Care | Payer: Medicaid Other | Source: Ambulatory Visit | Attending: Internal Medicine | Admitting: Internal Medicine

## 2011-06-11 ENCOUNTER — Encounter (HOSPITAL_COMMUNITY)
Admission: RE | Disposition: A | Discharge status: Home / Self Care | Payer: Self-pay | Source: Ambulatory Visit | Attending: Internal Medicine

## 2011-06-11 DIAGNOSIS — J449 Chronic obstructive pulmonary disease, unspecified: Secondary | ICD-10-CM | POA: Insufficient documentation

## 2011-06-11 DIAGNOSIS — G20 Parkinson's disease: Secondary | ICD-10-CM | POA: Insufficient documentation

## 2011-06-11 DIAGNOSIS — D509 Iron deficiency anemia, unspecified: Secondary | ICD-10-CM | POA: Insufficient documentation

## 2011-06-11 DIAGNOSIS — Z800 Family history of malignant neoplasm of digestive organs: Secondary | ICD-10-CM

## 2011-06-11 DIAGNOSIS — Z860100 Personal history of colon polyps, unspecified: Secondary | ICD-10-CM | POA: Insufficient documentation

## 2011-06-11 DIAGNOSIS — G20A1 Parkinson's disease without dyskinesia, without mention of fluctuations: Secondary | ICD-10-CM | POA: Insufficient documentation

## 2011-06-11 DIAGNOSIS — J4489 Other specified chronic obstructive pulmonary disease: Secondary | ICD-10-CM | POA: Insufficient documentation

## 2011-06-11 HISTORY — DX: Anemia, unspecified: D64.9

## 2011-06-11 HISTORY — DX: Major depressive disorder, single episode, unspecified: F32.9

## 2011-06-11 HISTORY — PX: COLONOSCOPY: SHX5424

## 2011-06-11 HISTORY — DX: Fibromyalgia: M79.7

## 2011-06-11 HISTORY — DX: Depression, unspecified: F32.A

## 2011-06-11 HISTORY — DX: Chronic obstructive pulmonary disease, unspecified: J44.9

## 2011-06-11 SURGERY — COLONOSCOPY
Anesthesia: Moderate Sedation

## 2011-06-11 MED ORDER — MEPERIDINE HCL 50 MG/ML IJ SOLN
INTRAMUSCULAR | Status: AC
  Filled 2011-06-11: qty 1

## 2011-06-11 MED ORDER — MIDAZOLAM HCL 5 MG/5ML IJ SOLN
INTRAMUSCULAR | Status: AC
  Filled 2011-06-11: qty 10

## 2011-06-11 MED ORDER — STERILE WATER FOR IRRIGATION IR SOLN
Status: DC | PRN
  Administered 2011-06-11: 15:00:00

## 2011-06-11 MED ORDER — MEPERIDINE HCL 50 MG/ML IJ SOLN
INTRAMUSCULAR | Status: DC | PRN
  Administered 2011-06-11 (×2): 25 mg via INTRAVENOUS

## 2011-06-11 MED ORDER — MIDAZOLAM HCL 5 MG/5ML IJ SOLN
INTRAMUSCULAR | Status: DC | PRN
  Administered 2011-06-11 (×5): 2 mg via INTRAVENOUS

## 2011-06-11 NOTE — Brief Op Note (Signed)
06/11/2011  4:04 PM  PATIENT:  Lacey Gonzales  60 y.o. female  PRE-OPERATIVE DIAGNOSIS:  Family History of Colon Cancer, history of Polyps  POST-OPERATIVE DIAGNOSIS:  * No post-op diagnosis entered *  PROCEDURE:  Procedure(s): COLONOSCOPY  SURGEON:  Surgeon(s): Malissa Hippo, MD  Colonoscopy note; Exam to cecum; Normal exam limited by the quality of prep was fair at best.

## 2011-06-11 NOTE — H&P (Signed)
Lacey Gonzales is an 60 y.o. female.   Chief Complaint: Patient is here for colonoscopy HPI: Patient is 60 year old Caucasian female who has history of colonic polyps. Her last exam was in [place] about 10 years ago. She was advised to come back in one year she elected not to. He has had mild anemia however she has not experienced melena rectal bleeding and in her stool has been heme negative.Marland Kitchen He denies abdominal pain anorexia or weight loss. Family history is positive for colon carcinoma her brother who had a PR at age 34 and killed himself he could not cope with this and  Past Medical History  Diagnosis Date  . CHF (congestive heart failure)   . Fibromyalgia   . Depression   . Parkinson's disease   . Anemia   . COPD (chronic obstructive pulmonary disease)     Past Surgical History  Procedure Date  . Abdominal hysterectomy   . Cholecystectomy   . Exploratory laparotomy     Family History  Problem Relation Age of Onset  . Cancer Father   . Depression Father   . Coronary artery disease Father    Social History:  reports that she has been smoking Cigarettes.  She has a 20 pack-year smoking history. She does not have any smokeless tobacco history on file. She reports that she does not drink alcohol or use illicit drugs.  Allergies:  Allergies  Allergen Reactions  . Sulfur     Medications Prior to Admission  Medication Dose Route Frequency Provider Last Rate Last Dose  . 0.45 % sodium chloride infusion   Intravenous Once Malissa Hippo, MD 20 mL/hr at 06/11/11 1447 1,000 mL at 06/11/11 1447  . meperidine (DEMEROL) 50 MG/ML injection           . midazolam (VERSED) 5 MG/5ML injection           . DISCONTD: meperidine (DEMEROL) 50 MG/ML injection           . DISCONTD: midazolam (VERSED) 5 MG/5ML injection            Medications Prior to Admission  Medication Sig Dispense Refill  . ALPRAZolam (XANAX) 0.5 MG tablet Take 0.5 mg by mouth 3 (three) times daily.        .  benzonatate (TESSALON) 100 MG capsule Take 100 mg by mouth 3 (three) times daily as needed.        . carvedilol (COREG) 25 MG tablet Take 25 mg by mouth 2 (two) times daily with a meal.        . doxepin (SINEQUAN) 10 MG capsule Take 10 mg by mouth 1 day or 1 dose.        . esomeprazole (NEXIUM) 40 MG capsule Take 40 mg by mouth 2 (two) times daily.        . furosemide (LASIX) 20 MG tablet Take 20 mg by mouth daily.        Marland Kitchen guaiFENesin-codeine (ROBITUSSIN AC) 100-10 MG/5ML syrup Take 5 mLs by mouth.        Marland Kitchen HYDROcodone-acetaminophen (VICODIN) 5-500 MG per tablet Take 1 tablet by mouth 1 day or 1 dose.        . meloxicam (MOBIC) 15 MG tablet Take 15 mg by mouth daily.        . promethazine (PHENERGAN) 25 MG tablet Take 25 mg by mouth every 6 (six) hours as needed.        . sertraline (ZOLOFT) 50 MG tablet Take 50  mg by mouth daily.        . trihexyphenidyl (ARTANE) 2 MG tablet Take 2 mg by mouth 3 (three) times daily with meals.        Marland Kitchen zolpidem (AMBIEN CR) 12.5 MG CR tablet Take 12.5 mg by mouth at bedtime as needed.          No results found for this or any previous visit (from the past 48 hour(s)). No results found.  Review of Systems  Gastrointestinal: Positive for constipation (good results with colace). Negative for heartburn, nausea, vomiting, abdominal pain, diarrhea, blood in stool and melena.    Blood pressure 149/82, pulse 90, temperature 98.7 F (37.1 C), temperature source Oral, resp. rate 22, height 5' (1.524 m), weight 161 lb (73.029 kg), SpO2 98.00%. Physical Exam   Assessment/Plan History of colonic polyps. Colonoscopy. Procedure and risks were reviewed with the patient and she is agreeable  [name] 06/11/2011, 3:21 PM

## 2011-06-12 NOTE — Op Note (Signed)
NAME:  [name], [name]              ACCOUNT NO.:  [id number]  MEDICAL RECORD NO.:  [id number]  LOCATION:                                 FACILITY:  PHYSICIAN:  Lionel December, M.D.    DATE OF BIRTH:  [birth year]  DATE OF PROCEDURE: DATE OF DISCHARGE:                              OPERATIVE REPORT   PROCEDURE:  Colonoscopy.  INDICATION:  Lacey Gonzales is 60 year old Caucasian female who has history of colonic polyps.  [place].  She had three polyps removed and she was advised to come back in a year, but she decided not to.  She has some mild anemia, but no evidence of GI bleed.  Family history is also significant for colon carcinoma in her brother who died at age of 108.  Procedures were reviewed with the patient.  Informed consent was obtained.  MEDS FOR CONSCIOUS SEDATION: 1. Demerol 50 mg IV. 2. Versed 10 mg IV.  FINDINGS:  Procedure performed in endoscopy suite.  The patient's vital signs and O2 sat were monitored during the procedure and remained stable.  The patient was placed in left lateral recumbent position. Rectal examination performed.  No abnormality noted on external or digital exam.  Pentax videoscope was placed through rectum and advanced under vision into sigmoid colon and beyond.  She had lot of thick liquid stool.  Nevertheless, scope was passed into cecum which was identified by ileocecal valve and appendiceal stump.  Pictures taken for the record.  As the scope was withdrawn, the colonic mucosa was examined as best as possible.  No polyps and tumor masses were noted.  Rectal mucosa similarly was normal.  Scope was retroflexed to examine anorectal junction which was unremarkable.  Endoscope was withdrawn.  Withdrawal time was 11 minutes.  The patient tolerated the procedure well.  FINAL DIAGNOSES: 1. Examination performed to cecum. 2. Normal examination, but limited by quality of prep.  RECOMMENDATIONS: 1.  Standard instructions given. 2. The patient should consider having next colonoscopy no later than 5     years, and she will need 2-day prep.          ______________________________ Lionel December, M.D.     NR/MEDQ  D:  06/11/2011  T:  06/12/2011  Job:  [id number]  cc:   Ladona Horns. Mariel Sleet, MD Fax: [phone]  Dr. Phillips Odor

## 2011-06-19 ENCOUNTER — Encounter (HOSPITAL_COMMUNITY): Payer: Self-pay | Admitting: Internal Medicine

## 2011-07-31 LAB — BLOOD GAS, ARTERIAL
Acid-Base Excess: 0.1
Bicarbonate: 24.5 — ABNORMAL HIGH
Bicarbonate: 25.1 — ABNORMAL HIGH
FIO2: 0.21
FIO2: 21
O2 Saturation: 97.2
O2 Saturation: 98.1
Patient temperature: 37
pH, Arterial: 7.42 — ABNORMAL HIGH
pO2, Arterial: 96

## 2011-07-31 LAB — DIFFERENTIAL
Basophils Absolute: 0.1
Basophils Relative: 1
Eosinophils Absolute: 0.1
Eosinophils Relative: 1
Neutrophils Relative %: 68

## 2011-07-31 LAB — URINALYSIS, ROUTINE W REFLEX MICROSCOPIC
Glucose, UA: NEGATIVE
Hgb urine dipstick: NEGATIVE
Protein, ur: NEGATIVE
Specific Gravity, Urine: 1.01
Urobilinogen, UA: 0.2

## 2011-07-31 LAB — COMPREHENSIVE METABOLIC PANEL
ALT: 30
Alkaline Phosphatase: 88
CO2: 28
Calcium: 9.3
Chloride: 105
GFR calc non Af Amer: >60
Glucose, Bld: 90
Sodium: 139
Total Bilirubin: 0.6

## 2011-07-31 LAB — PROTIME-INR
INR: 0.9
Prothrombin Time: 12.2

## 2011-07-31 LAB — CBC
Hemoglobin: 13.4
MCHC: 34.2
RBC: 4.42
WBC: 7.1

## 2011-07-31 LAB — APTT: aPTT: 28

## 2011-08-12 ENCOUNTER — Telehealth (HOSPITAL_COMMUNITY): Payer: Self-pay

## 2011-08-12 ENCOUNTER — Encounter (HOSPITAL_COMMUNITY): Payer: Medicare Other | Attending: Oncology | Admitting: Oncology

## 2011-08-12 ENCOUNTER — Encounter (HOSPITAL_BASED_OUTPATIENT_CLINIC_OR_DEPARTMENT_OTHER): Payer: Medicare Other

## 2011-08-12 VITALS — BP 137/93 | HR 89 | Temp 98.1°F | Wt 169.8 lb

## 2011-08-12 DIAGNOSIS — D649 Anemia, unspecified: Secondary | ICD-10-CM

## 2011-08-12 DIAGNOSIS — D509 Iron deficiency anemia, unspecified: Secondary | ICD-10-CM

## 2011-08-12 DIAGNOSIS — D696 Thrombocytopenia, unspecified: Secondary | ICD-10-CM

## 2011-08-12 DIAGNOSIS — N342 Other urethritis: Secondary | ICD-10-CM | POA: Insufficient documentation

## 2011-08-12 LAB — URINALYSIS, ROUTINE W REFLEX MICROSCOPIC
Bilirubin Urine: NEGATIVE
Glucose, UA: NEGATIVE mg/dL
Hgb urine dipstick: NEGATIVE
Ketones, ur: NEGATIVE mg/dL
Leukocytes, UA: NEGATIVE
Protein, ur: NEGATIVE mg/dL
pH: 7 (ref 5.0–8.0)

## 2011-08-12 LAB — CBC
MCH: 30.9 pg (ref 26.0–34.0)
MCHC: 34.8 g/dL (ref 30.0–36.0)
Platelets: 125 10*3/uL — ABNORMAL LOW (ref 150–400)
RDW: 12.3 % (ref 11.5–15.5)

## 2011-08-12 LAB — IRON AND TIBC
Iron: 71 ug/dL (ref 42–135)
TIBC: 382 ug/dL (ref 250–470)

## 2011-08-12 NOTE — Progress Notes (Signed)
This office note has been dictated.

## 2011-08-12 NOTE — Progress Notes (Signed)
Labs drawn today for cbc,fe,Ibc

## 2011-08-12 NOTE — Patient Instructions (Signed)
[facility]  Discharge Instructions  RECOMMENDATIONS MADE BY THE CONSULTANT AND ANY TEST RESULTS WILL BE SENT TO YOUR REFERRING DOCTOR.   SPECIAL INSTRUCTIONS/FOLLOW-UP: Lab work Needed  and Return to Clinic as scheduled at the front desk.  You will be called with the results of the urine test if they are abnormal.   I acknowledge that I have been informed and understand all the instructions given to me and received a copy. I do not have any more questions at this time, but understand that I may call the Specialty Clinic at [facility] at [phone] during business hours should I have any further questions or need assistance in obtaining follow-up care.    __________________________________________  _____________  __________ Signature of Patient or Authorized Representative            Date                   Time    __________________________________________ Nurse's Signature

## 2011-08-12 NOTE — Progress Notes (Signed)
CC:   Dorisann Frames, M.D. Corrie Mckusick, M.D.  DIAGNOSES: 1. Anemia which has now resolved. 2. Possible iron-deficiency on replacement Niferex and we will see     what her iron studies show. 3. Intermittent thrombocytopenia which is stable at 125,000 today. 4. Hyperthyroidism status post radioactive iodine ablation May 21, 2011 and she has not seen Dr. Talmage Nap in follow up.  I have     encouraged her to make that appointment when they get home.  She     has been 10 weeks without follow up for her TSH etc. 5. Urethritis symptomatology and a urinalysis is pending from today. 6. Bipolar disorder. 7. History of Parkinson's disease. 8. History of hysterectomy 1975. 9. Cholecystectomy in the past. 10.History of exploratory laparotomy in the past. 11.History of coronary artery disease with congestive heart failure in     the past.  [place] wanted to know if I could treat her thyroid disorder with thyroid replacement, but I informed that really Dr. Talmage Nap needs to be doing that.  She is going to make an appointment when she gets back.  She does not feel good.  She states that she is weaker and her daughter is with her today.  Her vital signs today showed a weight of 169 pounds that is different from 152 pounds from April.  Her blood pressure is 137/93 that corresponds with a blood pressure 124/80 from April.  Her pulse is right around 89, respirations 20 and unlabored at rest.  She is afebrile.  I did not examine her further since her hemoglobin has stayed in the normal range now.  We will see what her iron studies show and I would like to see her back in January after she gets squared away on her replacement thyroid medication.    ______________________________ Ladona Horns. Mariel Sleet, MD ESN/MEDQ  D:  08/12/2011  T:  08/12/2011  Job:  [id number]

## 2011-08-12 NOTE — Telephone Encounter (Signed)
Patient called to inform of lab results.  Patient verbalized understanding.

## 2011-08-13 LAB — URINE CULTURE
Colony Count: 90000
Culture  Setup Time: 201210021120

## 2011-09-16 ENCOUNTER — Encounter: Payer: Self-pay | Admitting: Oncology

## 2011-12-01 ENCOUNTER — Encounter (HOSPITAL_COMMUNITY): Payer: Medicare Other | Attending: Oncology

## 2011-12-01 DIAGNOSIS — D649 Anemia, unspecified: Secondary | ICD-10-CM

## 2011-12-01 DIAGNOSIS — N342 Other urethritis: Secondary | ICD-10-CM

## 2011-12-01 LAB — RETICULOCYTES
Retic Count, Absolute: 82.8 10*3/uL (ref 19.0–186.0)
Retic Ct Pct: 2.4 % (ref 0.4–3.1)

## 2011-12-01 LAB — IRON AND TIBC
Saturation Ratios: 13 % — ABNORMAL LOW (ref 20–55)
UIBC: 374 ug/dL (ref 125–400)

## 2011-12-01 LAB — CBC
MCV: 93 fL (ref 78.0–100.0)
Platelets: 145 10*3/uL — ABNORMAL LOW (ref 150–400)
RDW: 13.5 % (ref 11.5–15.5)
WBC: 7 10*3/uL (ref 4.0–10.5)

## 2011-12-01 NOTE — Progress Notes (Signed)
Labs drawn today for cbc,retic,Iron and IBC, ferr

## 2011-12-03 ENCOUNTER — Encounter (HOSPITAL_BASED_OUTPATIENT_CLINIC_OR_DEPARTMENT_OTHER): Payer: Medicare Other | Admitting: Oncology

## 2011-12-03 VITALS — BP 112/78 | HR 97 | Temp 97.4°F

## 2011-12-03 DIAGNOSIS — D509 Iron deficiency anemia, unspecified: Secondary | ICD-10-CM

## 2011-12-03 DIAGNOSIS — D649 Anemia, unspecified: Secondary | ICD-10-CM

## 2011-12-03 DIAGNOSIS — E611 Iron deficiency: Secondary | ICD-10-CM

## 2011-12-03 DIAGNOSIS — F319 Bipolar disorder, unspecified: Secondary | ICD-10-CM

## 2011-12-03 MED ORDER — SODIUM CHLORIDE 0.9 % IV SOLN: 1020.0000 mg | Freq: Once | INTRAVENOUS | Status: DC

## 2011-12-03 NOTE — Progress Notes (Signed)
This office note has been dictated.

## 2011-12-03 NOTE — Progress Notes (Signed)
CC:   Corrie Mckusick, M.D. Lionel December, M.D.  DIAGNOSIS: 1. Iron-deficiency anemia. 2. Hypothyroidism status post I 131 therapy. 3. Mild thrombocytopenia. 4. Bipolar disorder. 5. Fibromyalgia. 6. Cholecystectomy in the past. 7. Possible Parkinson disease. 8. History of coronary artery disease with CHF. 9. History of hypertension.   She still has a mild anemia and absolutely has iron-deficiency.  Oral iron did not replace her iron stores.  She just returned from Dr. Lara Mulch, she states, and her 2 good friends brought her today.  From my standpoint, I think the oral iron is not useful in continuing and I have offered her 1 dose of Feraheme heme 1020 mg, which we will give to her sometime in the next 1-2 weeks.  We will then check her iron stores about 6 weeks later and see where we stand but I think they should be better.  I think we also need to check her serum iron and TIBC since they have sometimes been more reliable than the ferritin.  So her hemoglobin has dropped from 12.5 in October to 11.0 the other day. Platelets are still mildly low at 145,000 and her white count remains unremarkable.  I hope that the iron that might help her feel a little bit better, but I think it is worth at least 1 trial of IV iron replacement to see if it does.  Will see her back after the labs in March.    ______________________________ Ladona Horns. Mariel Sleet, MD ESN/MEDQ  D:  12/03/2011  T:  12/03/2011  Job:  [id number]

## 2011-12-03 NOTE — Patient Instructions (Signed)
[facility]  Discharge Instructions  RECOMMENDATIONS MADE BY THE CONSULTANT AND ANY TEST RESULTS WILL BE SENT TO YOUR REFERRING DOCTOR.   EXAM FINDINGS BY MD TODAY AND SIGNS AND SYMPTOMS TO REPORT TO CLINIC OR PRIMARY MD: Your hemoglobin is not very low but your iron studies are low.  We will give you iron by IV on Feb 5th  SPECIAL INSTRUCTIONS/FOLLOW-UP: Other (Referral/Appointments) labs 6 weeks after iron infusion. Then to see our PA.   I acknowledge that I have been informed and understand all the instructions given to me and received a copy. I do not have any more questions at this time, but understand that I may call the Specialty Clinic at [facility] at [phone] during business hours should I have any further questions or need assistance in obtaining follow-up care.    __________________________________________  _____________  __________ Signature of Patient or Authorized Representative            Date                   Time    __________________________________________ Nurse's Signature

## 2011-12-16 ENCOUNTER — Ambulatory Visit (HOSPITAL_COMMUNITY): Payer: Medicare Other

## 2012-01-19 ENCOUNTER — Encounter (HOSPITAL_COMMUNITY): Payer: Medicare Other | Attending: Oncology

## 2012-01-19 DIAGNOSIS — E611 Iron deficiency: Secondary | ICD-10-CM

## 2012-01-19 DIAGNOSIS — D509 Iron deficiency anemia, unspecified: Secondary | ICD-10-CM | POA: Insufficient documentation

## 2012-01-19 DIAGNOSIS — D696 Thrombocytopenia, unspecified: Secondary | ICD-10-CM | POA: Insufficient documentation

## 2012-01-19 LAB — IRON AND TIBC
Iron: 60 ug/dL (ref 42–135)
UIBC: >575 ug/dL — ABNORMAL HIGH (ref 125–400)

## 2012-01-19 LAB — CBC
HCT: 36.2 % (ref 36.0–46.0)
Hemoglobin: 12.3 g/dL (ref 12.0–15.0)
MCH: 31.1 pg (ref 26.0–34.0)
RBC: 3.95 MIL/uL (ref 3.87–5.11)

## 2012-01-19 LAB — FERRITIN: Ferritin: 141 ng/mL (ref 10–291)

## 2012-01-19 NOTE — Progress Notes (Signed)
Labs drawn today for cbc,ferr,Iron and IBC 

## 2012-01-21 ENCOUNTER — Encounter (HOSPITAL_COMMUNITY): Payer: Self-pay | Admitting: Oncology

## 2012-01-21 ENCOUNTER — Encounter (HOSPITAL_BASED_OUTPATIENT_CLINIC_OR_DEPARTMENT_OTHER): Payer: Medicare Other | Admitting: Oncology

## 2012-01-21 VITALS — BP 123/86 | HR 98 | Temp 98.1°F | Wt 174.3 lb

## 2012-01-21 DIAGNOSIS — M797 Fibromyalgia: Secondary | ICD-10-CM

## 2012-01-21 DIAGNOSIS — D696 Thrombocytopenia, unspecified: Secondary | ICD-10-CM

## 2012-01-21 DIAGNOSIS — E039 Hypothyroidism, unspecified: Secondary | ICD-10-CM

## 2012-01-21 DIAGNOSIS — F319 Bipolar disorder, unspecified: Secondary | ICD-10-CM

## 2012-01-21 DIAGNOSIS — D509 Iron deficiency anemia, unspecified: Secondary | ICD-10-CM

## 2012-01-21 HISTORY — DX: Bipolar disorder, unspecified: F31.9

## 2012-01-21 HISTORY — DX: Hypothyroidism, unspecified: E03.9

## 2012-01-21 HISTORY — DX: Iron deficiency anemia, unspecified: D50.9

## 2012-01-21 HISTORY — DX: Thrombocytopenia, unspecified: D69.6

## 2012-01-21 HISTORY — DX: Fibromyalgia: M79.7

## 2012-01-21 NOTE — Progress Notes (Signed)
Colette Ribas, MD, MD [address] [place] [address]  1. Iron deficiency anemia  CBC, Differential, Vitamin B12, Folate, Iron and TIBC, Ferritin  2. Mild Thrombocytopenia  CBC, Vitamin B12, Folate, Iron and TIBC, Ferritin  3. Bipolar disorder    4. Hypothyroidism    5. Fibromyalgia      CURRENT THERAPY: Niferex daily.  INTERVAL HISTORY: Lacey Gonzales 61 y.o. female returns for  regular  visit for followup of iron deficiency anemia.     The patient was scheduled for an IV Feraheme infusion in February, but the patient was a no show.  Despite this, interestingly, her lab work recently shows a ferritin of 141 which is well within normal limits.  Her serum iron is solid at 60.  Her hemoglobin is within normal limits at 12.3 g/dL and a normal MCV.  Her platelets remain stable and mildly low at 132,000. I personally reviewed and went over laboratory results with the patient.  The patient reports many medical complaints that are not related her her anemia or platelets.  I have encouraged the patient to follow-up with her medical physician regarding her "knee pain," "fatigue," etc.  I reports that her hemoglobin is within normal limits so her fatigue is not related to her anemia and her present platelet count will not effect her energy level.  She understands this.   Past Medical History  Diagnosis Date  . CHF (congestive heart failure)   . Fibromyalgia   . Depression   . Parkinson's disease   . Anemia   . COPD (chronic obstructive pulmonary disease)   . Iron deficiency anemia 01/21/2012  . Mild Thrombocytopenia 01/21/2012  . Bipolar disorder 01/21/2012  . Hypothyroidism 01/21/2012    S/P I 131 therapy  . Fibromyalgia 01/21/2012    has KNEE, ARTHRITIS, DEGEN./OSTEO; KNEE PAIN; HIGH BLOOD PRESSURE; Iron deficiency anemia; Mild Thrombocytopenia; Bipolar disorder; Hypothyroidism; and Fibromyalgia on her problem list.     is allergic to sulfur.  Ms. Curling  does not currently have medications on file.  Past Surgical History  Procedure Date  . Abdominal hysterectomy   . Cholecystectomy   . Exploratory laparotomy   . Colonoscopy 06/11/2011    Procedure: COLONOSCOPY;  Surgeon: Malissa Hippo, MD;  Location: [facility];  Service: Endoscopy;  Laterality: N/A;    Denies any headaches, dizziness, double vision, fevers, chills, night sweats, nausea, vomiting, diarrhea, constipation, chest pain, heart palpitations, shortness of breath, blood in stool, black tarry stool, urinary pain, urinary burning, urinary frequency, hematuria.   PHYSICAL EXAMINATION  ECOG PERFORMANCE STATUS: 3 - Symptomatic, >50% confined to bed  Filed Vitals:   01/21/12 1114  BP: 123/86  Pulse: 98  Temp:     GENERAL:alert, well developed, comfortable, cooperative and flat affect SKIN: skin color, texture, turgor are normal HEAD: Normocephalic EYES: normal, right eye noted to be draining clear fluid EARS: External ears normal OROPHARYNX:mucous membranes are moist  NECK: supple, trachea midline LYMPH:  not examined BREAST:not examined LUNGS: clear to auscultation  HEART: regular rate & rhythm, no murmurs, no gallops, S1 normal and S2 normal ABDOMEN:abdomen soft, non-tender and normal bowel sounds BACK: Back symmetric, no curvature., No CVA tenderness EXTREMITIES:no clubbing, no cyanosis, positive findings:  edema B/L 1+ pitting edema in LE L>>R and dry skin on LE B/L  NEURO: alert & oriented x 3 with fluent speech, no focal motor/sensory deficits, gait normal   LABORATORY DATA: Results for LORIENE, TAUNTON (MRN  [id number]) as of 01/21/2012 12:04  Ref. Range 12/01/2011 08:33 01/19/2012 10:59  Iron Latest Range: 42-135 ug/dL 58 60  UIBC Latest Range: 125-400 ug/dL 130 >865 (H)  TIBC Latest Range: 250-470 ug/dL 784 NOT CALC  Saturation Ratios Latest Range: 20-55 % 13 (L) NOT CALC  Ferritin Latest Range: 10-291 ng/mL QUANTITY NOT SUFFICIENT, UNABLE TO PERFORM TEST  141  WBC Latest Range: 4.0-10.5 K/uL 7.0 6.4  RBC Latest Range: 3.87-5.11 MIL/uL 3.45 (L) 3.95  HGB Latest Range: 12.0-15.0 g/dL 69.6 (L) 29.5  HCT Latest Range: 36.0-46.0 % 32.1 (L) 36.2  MCV Latest Range: 78.0-100.0 fL 93.0 91.6  MCH Latest Range: 26.0-34.0 pg 31.9 31.1  MCHC Latest Range: 30.0-36.0 g/dL 28.4 13.2  RDW Latest Range: 11.5-15.5 % 13.5 12.2  Platelets Latest Range: 150-400 K/uL 145 (L) 132 (L)      ASSESSMENT:  1. Iron-deficiency anemia, significantly improved.  Continue oral iron (Niferex). 2. Mild thrombocytopenia, stable 3. Hypothyroidism status post I 131 therapy. 4. Bipolar disorder.  5. Fibromyalgia.  6. Cholecystectomy in the past.  7. Possible Parkinson disease.  8. History of coronary artery disease with CHF.  9. History of hypertension.   PLAN:  1. I personally reviewed and went over laboratory results with the patient.  Her ferritin, hemoglobin, and MCV are within normal limits without a Feraheme IV infusion (she was a no show). 2. Lab work in 3 months: CBC diff, Anemia panel 3. Patient encouraged to discuss other medical issues with her PCP 4. Return in 3 months following lab work.  If her 3 month lab work is stable, will see her again in 6-8 months with lab work.  If in 6-8 months, her lab work is stable, will entertain releasing her from the clinic.   All questions were answered. The patient knows to call the clinic with any problems, questions or concerns. We can certainly see the patient much sooner if necessary.  The patient and plan discussed with Glenford Peers, MD and he is in agreement with the aforementioned.   Tino Ronan

## 2012-01-21 NOTE — Patient Instructions (Signed)
Lacey Gonzales  [id number] Dec 02, 1950   [facility]  Discharge Instructions  RECOMMENDATIONS MADE BY THE CONSULTANT AND ANY TEST RESULTS WILL BE SENT TO YOUR REFERRING DOCTOR.   EXAM FINDINGS BY MD TODAY AND SIGNS AND SYMPTOMS TO REPORT TO CLINIC OR PRIMARY MD: To continue niferex.  Report shortness of breath, increased ice intake, etc.  MEDICATIONS PRESCRIBED: continue niferex     SPECIAL INSTRUCTIONS/FOLLOW-UP: Lab work Needed in 3 months and Return to Clinic after labs to see PA   I acknowledge that I have been informed and understand all the instructions given to me and received a copy. I do not have any more questions at this time, but understand that I may call the Specialty Clinic at [facility] at [phone] during business hours should I have any further questions or need assistance in obtaining follow-up care.    __________________________________________  _____________  __________ Signature of Patient or Authorized Representative            Date                   Time    __________________________________________ Nurse's Signature

## 2012-02-24 ENCOUNTER — Other Ambulatory Visit (HOSPITAL_COMMUNITY): Payer: Self-pay | Admitting: Family Medicine

## 2012-02-24 ENCOUNTER — Ambulatory Visit (HOSPITAL_COMMUNITY)
Admission: RE | Admit: 2012-02-24 | Discharge: 2012-02-24 | Disposition: A | Discharge status: Home / Self Care | Payer: Medicare Other | Source: Ambulatory Visit | Attending: Family Medicine | Admitting: Family Medicine

## 2012-02-24 DIAGNOSIS — J209 Acute bronchitis, unspecified: Secondary | ICD-10-CM | POA: Insufficient documentation

## 2012-02-24 DIAGNOSIS — R0989 Other specified symptoms and signs involving the circulatory and respiratory systems: Secondary | ICD-10-CM | POA: Insufficient documentation

## 2012-02-24 DIAGNOSIS — R0602 Shortness of breath: Secondary | ICD-10-CM | POA: Insufficient documentation

## 2012-04-20 ENCOUNTER — Other Ambulatory Visit (HOSPITAL_COMMUNITY): Payer: Self-pay | Admitting: Oncology

## 2012-04-20 DIAGNOSIS — D509 Iron deficiency anemia, unspecified: Secondary | ICD-10-CM

## 2012-04-20 MED ORDER — POLYSACCHARIDE IRON COMPLEX 150 MG PO CAPS: 150.0000 mg | ORAL_CAPSULE | Freq: Every day | ORAL | Status: DC

## 2012-04-22 ENCOUNTER — Encounter (HOSPITAL_COMMUNITY): Payer: Medicare Other | Attending: Oncology

## 2012-04-22 DIAGNOSIS — J449 Chronic obstructive pulmonary disease, unspecified: Secondary | ICD-10-CM | POA: Insufficient documentation

## 2012-04-22 DIAGNOSIS — G20 Parkinson's disease: Secondary | ICD-10-CM | POA: Insufficient documentation

## 2012-04-22 DIAGNOSIS — R42 Dizziness and giddiness: Secondary | ICD-10-CM | POA: Insufficient documentation

## 2012-04-22 DIAGNOSIS — D696 Thrombocytopenia, unspecified: Secondary | ICD-10-CM

## 2012-04-22 DIAGNOSIS — I517 Cardiomegaly: Secondary | ICD-10-CM | POA: Insufficient documentation

## 2012-04-22 DIAGNOSIS — IMO0001 Reserved for inherently not codable concepts without codable children: Secondary | ICD-10-CM | POA: Insufficient documentation

## 2012-04-22 DIAGNOSIS — J4489 Other specified chronic obstructive pulmonary disease: Secondary | ICD-10-CM | POA: Insufficient documentation

## 2012-04-22 DIAGNOSIS — G20A1 Parkinson's disease without dyskinesia, without mention of fluctuations: Secondary | ICD-10-CM | POA: Insufficient documentation

## 2012-04-22 DIAGNOSIS — I509 Heart failure, unspecified: Secondary | ICD-10-CM | POA: Insufficient documentation

## 2012-04-22 DIAGNOSIS — E039 Hypothyroidism, unspecified: Secondary | ICD-10-CM | POA: Insufficient documentation

## 2012-04-22 DIAGNOSIS — D509 Iron deficiency anemia, unspecified: Secondary | ICD-10-CM | POA: Insufficient documentation

## 2012-04-22 DIAGNOSIS — Z79899 Other long term (current) drug therapy: Secondary | ICD-10-CM | POA: Insufficient documentation

## 2012-04-22 DIAGNOSIS — F319 Bipolar disorder, unspecified: Secondary | ICD-10-CM | POA: Insufficient documentation

## 2012-04-22 DIAGNOSIS — R002 Palpitations: Secondary | ICD-10-CM | POA: Insufficient documentation

## 2012-04-22 LAB — CBC
MCHC: 34 g/dL (ref 30.0–36.0)
RDW: 13.4 % (ref 11.5–15.5)
WBC: 7.8 10*3/uL (ref 4.0–10.5)

## 2012-04-22 LAB — DIFFERENTIAL
Basophils Absolute: 0 10*3/uL (ref 0.0–0.1)
Basophils Relative: 0 % (ref 0–1)
Neutro Abs: 5.4 10*3/uL (ref 1.7–7.7)
Neutrophils Relative %: 69 % (ref 43–77)

## 2012-04-22 LAB — IRON AND TIBC: Iron: 52 ug/dL (ref 42–135)

## 2012-04-22 LAB — VITAMIN B12: Vitamin B-12: 547 pg/mL (ref 211–911)

## 2012-04-22 LAB — FOLATE: Folate: >20 ng/mL

## 2012-04-22 NOTE — Progress Notes (Signed)
Labs drawn today for cbc/diff,vb12,foalte,Iron and IBC, ferr

## 2012-04-23 ENCOUNTER — Encounter (HOSPITAL_BASED_OUTPATIENT_CLINIC_OR_DEPARTMENT_OTHER): Payer: Medicare Other

## 2012-04-23 ENCOUNTER — Encounter (HOSPITAL_COMMUNITY): Payer: Self-pay

## 2012-04-23 VITALS — BP 122/77 | HR 109 | Temp 97.9°F | Wt 169.0 lb

## 2012-04-23 DIAGNOSIS — D509 Iron deficiency anemia, unspecified: Secondary | ICD-10-CM

## 2012-04-23 DIAGNOSIS — J449 Chronic obstructive pulmonary disease, unspecified: Secondary | ICD-10-CM

## 2012-04-23 DIAGNOSIS — R002 Palpitations: Secondary | ICD-10-CM

## 2012-04-23 DIAGNOSIS — R42 Dizziness and giddiness: Secondary | ICD-10-CM

## 2012-04-23 NOTE — Progress Notes (Signed)
[facility] [phone]   [phone]  OFFICE PROGRESS NOTE  Colette Ribas, MD [address] [place] [address]  DIAGNOSIS: Iron deficiency anemia  PRIOR THERAPY: None.  CURRENT THERAPY: Niferex 150 one Capsule by mouth daily  INTERVAL HISTORY: Lacey Gonzales 61 y.o. female returns to the clinic today for three-month followup visit accompanied by her daughter and son-in-law. Absolute her to covering for Dr. Mariel Sleet. The patient is feeling fine today except for some dizzy spells when she changes position as well as occasional heart palpitations. She has multiple medical issues and she is followed by several physicians. She is tolerating her treatment with Niferex fairly well. She denied having any fatigue or weakness. The patient has repeat CBC and anemia panel performed recently and she is here today for evaluation and discussion of her lab results.  MEDICAL HISTORY: Past Medical History  Diagnosis Date  . CHF (congestive heart failure)   . Fibromyalgia   . Depression   . Parkinson's disease   . Anemia   . COPD (chronic obstructive pulmonary disease)   . Iron deficiency anemia 01/21/2012  . Mild Thrombocytopenia 01/21/2012  . Bipolar disorder 01/21/2012  . Hypothyroidism 01/21/2012    S/P I 131 therapy  . Fibromyalgia 01/21/2012  . Enlarged heart     ALLERGIES:  is allergic to sulfur.  MEDICATIONS:  Current Outpatient Prescriptions  Medication Sig Dispense Refill  . albuterol (PROVENTIL) (5 MG/ML) 0.5% nebulizer solution Take 2.5 mg by nebulization every 6 (six) hours as needed.      . ALPRAZolam (XANAX) 0.5 MG tablet Take 0.5 mg by mouth 3 (three) times daily.        . benzonatate (TESSALON) 100 MG capsule Take 100 mg by mouth 3 (three) times daily as needed.        . carvedilol (COREG) 25 MG tablet Take 25 mg by mouth 2 (two) times daily with a meal.        . doxepin (SINEQUAN) 10 MG capsule Take 10  mg by mouth 2 (two) times daily.       Marland Kitchen esomeprazole (NEXIUM) 40 MG capsule Take 40 mg by mouth 2 (two) times daily.        . fish oil-omega-3 fatty acids 1000 MG capsule Take by mouth daily.      . furosemide (LASIX) 20 MG tablet Take 20 mg by mouth as needed.       Marland Kitchen guaiFENesin-codeine (ROBITUSSIN AC) 100-10 MG/5ML syrup Take 5 mLs by mouth.        Marland Kitchen HYDROcodone-acetaminophen (VICODIN) 5-500 MG per tablet Take 1 tablet by mouth 1 day or 1 dose.        Marland Kitchen ipratropium (ATROVENT HFA) 17 MCG/ACT inhaler Inhale 2 puffs into the lungs as needed.      . iron polysaccharides (NIFEREX) 150 MG capsule Take 1 capsule (150 mg total) by mouth daily.  30 capsule  4  . levothyroxine (SYNTHROID, LEVOTHROID) 150 MCG tablet Take 150 mcg by mouth daily.      . meloxicam (MOBIC) 15 MG tablet Take 15 mg by mouth daily.        . Multiple Vitamins-Minerals (CENTRUM PO) Take 1 tablet by mouth daily.      . promethazine (PHENERGAN) 25 MG tablet Take 25 mg by mouth every 6 (six) hours as needed.        . sertraline (ZOLOFT) 50 MG tablet Take 50 mg by  mouth daily.        . trihexyphenidyl (ARTANE) 2 MG tablet Take 2 mg by mouth 3 (three) times daily with meals.        Marland Kitchen zolpidem (AMBIEN CR) 12.5 MG CR tablet Take 12.5 mg by mouth at bedtime as needed.          SURGICAL HISTORY:  Past Surgical History  Procedure Date  . Abdominal hysterectomy   . Cholecystectomy   . Exploratory laparotomy   . Colonoscopy 06/11/2011    Procedure: COLONOSCOPY;  Surgeon: Malissa Hippo, MD;  Location: [facility];  Service: Endoscopy;  Laterality: N/A;    REVIEW OF SYSTEMS:  A comprehensive review of systems was negative except for: Respiratory: positive for dyspnea on exertion Cardiovascular: positive for palpitations   PHYSICAL EXAMINATION: General appearance: alert, cooperative and no distress Neck: no adenopathy Lymph nodes: Cervical, supraclavicular, and axillary nodes normal. Resp: clear to auscultation  bilaterally Cardio: regular rate and rhythm, S1, S2 normal, no murmur, click, rub or gallop GI: soft, non-tender; bowel sounds normal; no masses,  no organomegaly Extremities: extremities normal, atraumatic, no cyanosis or edema  ECOG PERFORMANCE STATUS: 1 - Symptomatic but completely ambulatory  Blood pressure 122/77, pulse 109, temperature 97.9 F (36.6 C), temperature source Oral, weight 169 lb (76.658 kg), SpO2 98.00%.  LABORATORY DATA: Lab Results  Component Value Date   WBC 7.8 04/22/2012   HGB 12.2 04/22/2012   HCT 35.9* 04/22/2012   MCV 90.0 04/22/2012   PLT 137* 04/22/2012      Chemistry      Component Value Date/Time   NA 139 10/22/2009 1900   K 4.1 10/22/2009 1900   CL 106 10/22/2009 1900   CO2 26 10/22/2009 1900   BUN 18 10/22/2009 1900   CREATININE 0.76 10/22/2009 1900      Component Value Date/Time   CALCIUM 9.0 10/22/2009 1900   CALCIUM 8.6 12/29/2008 1625   ALKPHOS 86 10/22/2009 1900   AST 18 10/22/2009 1900   ALT 20 10/22/2009 1900   BILITOT 0.4 10/22/2009 1900     Other lab results: Iron 52, total iron binding capacity 361, iron saturation of 14%,  Ferritin 177.  RADIOGRAPHIC STUDIES: No results found.  ASSESSMENT: This is a very pleasant 61 years old white female with iron deficiency anemia currently on Niferex 150 mg by mouth daily. The patient is tolerating her treatment fairly well with no significant adverse effects. Her anemia panel is stable today with normal hemoglobin, hematocrit, iron and ferritin.  PLAN: I discussed the lab result with the patient and her family. I recommended for her to continue on Niferex 150 mg by mouth daily as ordered. She would come back for followup visit in 3 months with repeat CBC and iron study. She was advised to contact her primary care physician and cardiologist regarding the dizzy spells and palpations.  All questions were answered. The patient knows to call the clinic with any problems, questions or concerns. We  can certainly see the patient much sooner if necessary.

## 2012-07-22 ENCOUNTER — Other Ambulatory Visit (HOSPITAL_COMMUNITY): Payer: Medicare Other

## 2012-07-26 ENCOUNTER — Encounter (HOSPITAL_COMMUNITY): Payer: Self-pay | Admitting: Oncology

## 2012-07-26 ENCOUNTER — Ambulatory Visit (HOSPITAL_COMMUNITY): Payer: Medicare Other | Admitting: Oncology

## 2012-08-19 ENCOUNTER — Other Ambulatory Visit (HOSPITAL_COMMUNITY): Payer: Self-pay | Admitting: Oncology

## 2012-08-19 DIAGNOSIS — D509 Iron deficiency anemia, unspecified: Secondary | ICD-10-CM

## 2012-08-19 MED ORDER — POLYSACCHARIDE IRON COMPLEX 150 MG PO CAPS: 150.0000 mg | ORAL_CAPSULE | Freq: Every day | ORAL | Status: DC

## 2012-12-13 ENCOUNTER — Emergency Department (HOSPITAL_COMMUNITY)
Admission: EM | Admit: 2012-12-13 | Discharge: 2012-12-13 | Disposition: A | Discharge status: Home / Self Care | Payer: Medicare Other | Attending: Emergency Medicine | Admitting: Emergency Medicine

## 2012-12-13 ENCOUNTER — Encounter (HOSPITAL_COMMUNITY): Payer: Self-pay | Admitting: *Deleted

## 2012-12-13 DIAGNOSIS — J4489 Other specified chronic obstructive pulmonary disease: Secondary | ICD-10-CM | POA: Insufficient documentation

## 2012-12-13 DIAGNOSIS — E039 Hypothyroidism, unspecified: Secondary | ICD-10-CM | POA: Insufficient documentation

## 2012-12-13 DIAGNOSIS — Z79899 Other long term (current) drug therapy: Secondary | ICD-10-CM | POA: Insufficient documentation

## 2012-12-13 DIAGNOSIS — T391X5A Adverse effect of 4-Aminophenol derivatives, initial encounter: Secondary | ICD-10-CM | POA: Insufficient documentation

## 2012-12-13 DIAGNOSIS — F329 Major depressive disorder, single episode, unspecified: Secondary | ICD-10-CM | POA: Insufficient documentation

## 2012-12-13 DIAGNOSIS — IMO0001 Reserved for inherently not codable concepts without codable children: Secondary | ICD-10-CM | POA: Insufficient documentation

## 2012-12-13 DIAGNOSIS — F17200 Nicotine dependence, unspecified, uncomplicated: Secondary | ICD-10-CM | POA: Insufficient documentation

## 2012-12-13 DIAGNOSIS — F3289 Other specified depressive episodes: Secondary | ICD-10-CM | POA: Insufficient documentation

## 2012-12-13 DIAGNOSIS — F319 Bipolar disorder, unspecified: Secondary | ICD-10-CM | POA: Insufficient documentation

## 2012-12-13 DIAGNOSIS — D509 Iron deficiency anemia, unspecified: Secondary | ICD-10-CM | POA: Insufficient documentation

## 2012-12-13 DIAGNOSIS — Z8679 Personal history of other diseases of the circulatory system: Secondary | ICD-10-CM | POA: Insufficient documentation

## 2012-12-13 DIAGNOSIS — G20A1 Parkinson's disease without dyskinesia, without mention of fluctuations: Secondary | ICD-10-CM | POA: Insufficient documentation

## 2012-12-13 DIAGNOSIS — Z862 Personal history of diseases of the blood and blood-forming organs and certain disorders involving the immune mechanism: Secondary | ICD-10-CM | POA: Insufficient documentation

## 2012-12-13 DIAGNOSIS — G20 Parkinson's disease: Secondary | ICD-10-CM

## 2012-12-13 DIAGNOSIS — I509 Heart failure, unspecified: Secondary | ICD-10-CM | POA: Insufficient documentation

## 2012-12-13 DIAGNOSIS — J449 Chronic obstructive pulmonary disease, unspecified: Secondary | ICD-10-CM | POA: Insufficient documentation

## 2012-12-13 LAB — COMPREHENSIVE METABOLIC PANEL
AST: 13 U/L (ref 0–37)
Albumin: 3.8 g/dL (ref 3.5–5.2)
Calcium: 9.1 mg/dL (ref 8.4–10.5)
Chloride: 102 mEq/L (ref 96–112)
Creatinine, Ser: 0.83 mg/dL (ref 0.50–1.10)
Total Protein: 7 g/dL (ref 6.0–8.3)

## 2012-12-13 LAB — CBC WITH DIFFERENTIAL/PLATELET
Basophils Absolute: 0 10*3/uL (ref 0.0–0.1)
Basophils Relative: 0 % (ref 0–1)
Eosinophils Absolute: 0.2 10*3/uL (ref 0.0–0.7)
Eosinophils Relative: 3 % (ref 0–5)
HCT: 35.8 % — ABNORMAL LOW (ref 36.0–46.0)
MCH: 30.6 pg (ref 26.0–34.0)
MCHC: 33.5 g/dL (ref 30.0–36.0)
Monocytes Absolute: 0.5 10*3/uL (ref 0.1–1.0)
Neutro Abs: 4.5 10*3/uL (ref 1.7–7.7)
RDW: 13.5 % (ref 11.5–15.5)

## 2012-12-13 LAB — RAPID URINE DRUG SCREEN, HOSP PERFORMED
Amphetamines: NOT DETECTED
Barbiturates: NOT DETECTED
Benzodiazepines: POSITIVE — AB
Cocaine: NOT DETECTED
Opiates: POSITIVE — AB
Tetrahydrocannabinol: NOT DETECTED

## 2012-12-13 NOTE — ED Notes (Signed)
EMS called out by pt's daughter, stated pt has been taking too much medication, pt states daughter is stealing her vicodin. Pt is appropriate at this time.

## 2012-12-13 NOTE — ED Provider Notes (Signed)
History  This chart was scribed for Benny Lennert, MD by Ardeen Jourdain, ED Scribe. This patient was seen in room APA14/APA14 and the patient's care was started at 1109.  CSN: [id number]  Arrival date & time 12/13/12  1016   First MD Initiated Contact with Patient 12/13/12 1109      Chief Complaint  Patient presents with  . Altered Mental Status  . Drug Overdose    per pt's daughter but pills are accounted for per EMS     Patient is a 62 y.o. female presenting with altered mental status and Overdose. The history is provided by the patient. No language interpreter was used.  Altered Mental Status This is a new problem. The current episode started 6 to 12 hours ago. The problem occurs constantly. The problem has been gradually improving. Pertinent negatives include no chest pain, no abdominal pain, no headaches and no shortness of breath. Nothing aggravates the symptoms. Nothing relieves the symptoms.  Drug Overdose This is a new problem. The current episode started 1 to 2 hours ago. The problem has been gradually improving. Pertinent negatives include no chest pain, no abdominal pain, no headaches and no shortness of breath.    Lacey Gonzales is a 62 y.o. female brought in by ambulance, who presents to the Emergency Department complaining of altered mental status. Per pt- daughter is accusing her of taking all of her medication. She states "her daughter is trying to get back at her." She states she is feeling "okay" currently. Per EMS- daughter called because she believed pt was taking too much medication.    Past Medical History  Diagnosis Date  . CHF (congestive heart failure)   . Fibromyalgia   . Depression   . Parkinson's disease   . Anemia   . COPD (chronic obstructive pulmonary disease)   . Iron deficiency anemia 01/21/2012  . Mild Thrombocytopenia 01/21/2012  . Bipolar disorder 01/21/2012  . Hypothyroidism 01/21/2012    S/P I 131 therapy  . Fibromyalgia 01/21/2012  .  Enlarged heart     Past Surgical History  Procedure Date  . Abdominal hysterectomy   . Cholecystectomy   . Exploratory laparotomy   . Colonoscopy 06/11/2011    Procedure: COLONOSCOPY;  Surgeon: Malissa Hippo, MD;  Location: [facility];  Service: Endoscopy;  Laterality: N/A;    Family History  Problem Relation Age of Onset  . Cancer Father   . Depression Father   . Coronary artery disease Father     History  Substance Use Topics  . Smoking status: Current Every Day Smoker -- 0.5 packs/day for 40 years    Types: Cigarettes  . Smokeless tobacco: Not on file  . Alcohol Use: No   No OB history available.   Review of Systems  Constitutional: Negative for fatigue.  HENT: Negative for congestion, sinus pressure and ear discharge.   Eyes: Negative for discharge.  Respiratory: Negative for cough and shortness of breath.   Cardiovascular: Negative for chest pain.  Gastrointestinal: Negative for abdominal pain and diarrhea.  Genitourinary: Negative for frequency and hematuria.  Musculoskeletal: Negative for back pain.  Skin: Negative for rash.  Neurological: Negative for seizures and headaches.  Hematological: Negative.   Psychiatric/Behavioral: Positive for altered mental status. Negative for hallucinations.  All other systems reviewed and are negative.    Allergies  Sulfur  Home Medications   Current Outpatient Rx  Name  Route  Sig  Dispense  Refill  . ALBUTEROL SULFATE (5  MG/ML) 0.5% IN NEBU   Nebulization   Take 2.5 mg by nebulization every 6 (six) hours as needed.         . ALPRAZOLAM 0.5 MG PO TABS   Oral   Take 0.5 mg by mouth 3 (three) times daily.           Marland Kitchen BENZONATATE 100 MG PO CAPS   Oral   Take 100 mg by mouth 3 (three) times daily as needed.           Marland Kitchen CARVEDILOL 25 MG PO TABS   Oral   Take 25 mg by mouth 2 (two) times daily with a meal.           . DOXEPIN HCL 10 MG PO CAPS   Oral   Take 10 mg by mouth 2 (two) times daily.           Marland Kitchen ESOMEPRAZOLE MAGNESIUM 40 MG PO CPDR   Oral   Take 40 mg by mouth 2 (two) times daily.           . OMEGA-3 FATTY ACIDS 1000 MG PO CAPS   Oral   Take by mouth daily.         . FUROSEMIDE 20 MG PO TABS   Oral   Take 20 mg by mouth as needed.          . GUAIFENESIN-CODEINE 100-10 MG/5ML PO SYRP   Oral   Take 5 mLs by mouth.           Marland Kitchen HYDROCODONE-ACETAMINOPHEN 5-500 MG PO TABS   Oral   Take 1 tablet by mouth 1 day or 1 dose.           . IPRATROPIUM BROMIDE HFA 17 MCG/ACT IN AERS   Inhalation   Inhale 2 puffs into the lungs as needed.         Marland Kitchen POLYSACCHARIDE IRON COMPLEX 150 MG PO CAPS   Oral   Take 1 capsule (150 mg total) by mouth daily.   30 capsule   4   . LEVOTHYROXINE SODIUM 150 MCG PO TABS   Oral   Take 150 mcg by mouth daily.         . MELOXICAM 15 MG PO TABS   Oral   Take 15 mg by mouth daily.           . CENTRUM PO   Oral   Take 1 tablet by mouth daily.         Marland Kitchen PROMETHAZINE HCL 25 MG PO TABS   Oral   Take 25 mg by mouth every 6 (six) hours as needed.           . SERTRALINE HCL 50 MG PO TABS   Oral   Take 50 mg by mouth daily.           . TRIHEXYPHENIDYL HCL 2 MG PO TABS   Oral   Take 2 mg by mouth 3 (three) times daily with meals.           Marland Kitchen ZOLPIDEM TARTRATE ER 12.5 MG PO TBCR   Oral   Take 12.5 mg by mouth at bedtime as needed.             Triage Vitals: BP 149/77  Pulse 82  Temp 98.7 F (37.1 C) (Oral)  Resp 18  SpO2 100%  Physical Exam  Nursing note and vitals reviewed. Constitutional: She is oriented to person, place, and time. She appears well-developed and well-nourished.  No distress.  HENT:  Head: Normocephalic and atraumatic.  Eyes: Conjunctivae normal and EOM are normal. No scleral icterus.  Neck: Normal range of motion. Neck supple. No thyromegaly present.  Cardiovascular: Normal rate, regular rhythm and normal heart sounds.  Exam reveals no gallop and no friction rub.   No murmur  heard. Pulmonary/Chest: Effort normal and breath sounds normal. No stridor. No respiratory distress. She has no wheezes. She has no rales. She exhibits no tenderness.  Abdominal: Soft. Bowel sounds are normal. She exhibits no distension. There is no tenderness. There is no rebound.  Musculoskeletal: Normal range of motion. She exhibits no edema.  Lymphadenopathy:    She has no cervical adenopathy.  Neurological: She is oriented to person, place, and time. Coordination normal.  Skin: No rash noted. No erythema.  Psychiatric: She has a normal mood and affect. Her behavior is normal.    ED Course  Procedures (including critical care time)  DIAGNOSTIC STUDIES: Oxygen Saturation is 100% on room air, normal by my interpretation.    COORDINATION OF CARE:  11:20 AM: Discussed treatment plan which includes blood work and UA with pt at bedside and pt agreed to plan.   12:26 PM: labs reviewed, pt rechecked, she seems normal and comfortable, discharge was discussed    Results for orders placed during the hospital encounter of 12/13/12  CBC WITH DIFFERENTIAL      Component Value Range   WBC 6.4  4.0 - 10.5 K/uL   RBC 3.92  3.87 - 5.11 MIL/uL   Hemoglobin 12.0  12.0 - 15.0 g/dL   HCT 16.1 (*) 09.6 - 04.5 %   MCV 91.3  78.0 - 100.0 fL   MCH 30.6  26.0 - 34.0 pg   MCHC 33.5  30.0 - 36.0 g/dL   RDW 40.9  81.1 - 91.4 %   Platelets 171  150 - 400 K/uL   Neutrophils Relative 70  43 - 77 %   Neutro Abs 4.5  1.7 - 7.7 K/uL   Lymphocytes Relative 20  12 - 46 %   Lymphs Abs 1.3  0.7 - 4.0 K/uL   Monocytes Relative 7  3 - 12 %   Monocytes Absolute 0.5  0.1 - 1.0 K/uL   Eosinophils Relative 3  0 - 5 %   Eosinophils Absolute 0.2  0.0 - 0.7 K/uL   Basophils Relative 0  0 - 1 %   Basophils Absolute 0.0  0.0 - 0.1 K/uL  COMPREHENSIVE METABOLIC PANEL      Component Value Range   Sodium 138  135 - 145 mEq/L   Potassium 4.3  3.5 - 5.1 mEq/L   Chloride 102  96 - 112 mEq/L   CO2 28  19 - 32 mEq/L    Glucose, Bld 117 (*) 70 - 99 mg/dL   BUN 25 (*) 6 - 23 mg/dL   Creatinine, Ser 7.82  0.50 - 1.10 mg/dL   Calcium 9.1  8.4 - 95.6 mg/dL   Total Protein 7.0  6.0 - 8.3 g/dL   Albumin 3.8  3.5 - 5.2 g/dL   AST 13  0 - 37 U/L   ALT 10  0 - 35 U/L   Alkaline Phosphatase 130 (*) 39 - 117 U/L   Total Bilirubin 0.3  0.3 - 1.2 mg/dL   GFR calc non Af Amer 75 (*) >90 mL/min   GFR calc Af Amer 87 (*) >90 mL/min  URINE RAPID DRUG SCREEN (HOSP PERFORMED)  Component Value Range   Opiates POSITIVE (*) NONE DETECTED   Cocaine NONE DETECTED  NONE DETECTED   Benzodiazepines POSITIVE (*) NONE DETECTED   Amphetamines NONE DETECTED  NONE DETECTED   Tetrahydrocannabinol NONE DETECTED  NONE DETECTED   Barbiturates NONE DETECTED  NONE DETECTED  ACETAMINOPHEN LEVEL      Component Value Range   Acetaminophen (Tylenol), Serum <15.0  10 - 30 ug/mL  ETHANOL      Component Value Range   Alcohol, Ethyl (B) <11  0 - 11 mg/dL   No results found.      No diagnosis found.    MDM   Pt states she is not suicidal and she did not take extra medicine.   Pt alert at discharge and wants to go home    The chart was scribed for me under my direct supervision.  I personally performed the history, physical, and medical decision making and all procedures in the evaluation of this patient.Benny Lennert, MD 12/13/12 [phone]

## 2012-12-13 NOTE — ED Notes (Signed)
Patient's ride arrived. Patient discharged at this time. No complaints, no distress upon departure. Patient escorted by wheelchair to vehicle.

## 2012-12-13 NOTE — ED Notes (Signed)
Pt waiting for her ride

## 2013-02-09 ENCOUNTER — Emergency Department (HOSPITAL_COMMUNITY)
Admission: EM | Admit: 2013-02-09 | Discharge: 2013-02-10 | Disposition: A | Discharge status: Home / Self Care | Payer: Medicare Other | Attending: Emergency Medicine | Admitting: Emergency Medicine

## 2013-02-09 ENCOUNTER — Encounter (HOSPITAL_COMMUNITY): Payer: Self-pay | Admitting: *Deleted

## 2013-02-09 ENCOUNTER — Emergency Department (HOSPITAL_COMMUNITY): Payer: Medicare Other

## 2013-02-09 DIAGNOSIS — W19XXXA Unspecified fall, initial encounter: Secondary | ICD-10-CM

## 2013-02-09 DIAGNOSIS — Z79899 Other long term (current) drug therapy: Secondary | ICD-10-CM | POA: Insufficient documentation

## 2013-02-09 DIAGNOSIS — D509 Iron deficiency anemia, unspecified: Secondary | ICD-10-CM | POA: Insufficient documentation

## 2013-02-09 DIAGNOSIS — J4489 Other specified chronic obstructive pulmonary disease: Secondary | ICD-10-CM | POA: Insufficient documentation

## 2013-02-09 DIAGNOSIS — F319 Bipolar disorder, unspecified: Secondary | ICD-10-CM | POA: Insufficient documentation

## 2013-02-09 DIAGNOSIS — R410 Disorientation, unspecified: Secondary | ICD-10-CM

## 2013-02-09 DIAGNOSIS — Z791 Long term (current) use of non-steroidal anti-inflammatories (NSAID): Secondary | ICD-10-CM | POA: Insufficient documentation

## 2013-02-09 DIAGNOSIS — F17200 Nicotine dependence, unspecified, uncomplicated: Secondary | ICD-10-CM | POA: Insufficient documentation

## 2013-02-09 DIAGNOSIS — E039 Hypothyroidism, unspecified: Secondary | ICD-10-CM | POA: Insufficient documentation

## 2013-02-09 DIAGNOSIS — Z862 Personal history of diseases of the blood and blood-forming organs and certain disorders involving the immune mechanism: Secondary | ICD-10-CM | POA: Insufficient documentation

## 2013-02-09 DIAGNOSIS — J449 Chronic obstructive pulmonary disease, unspecified: Secondary | ICD-10-CM | POA: Insufficient documentation

## 2013-02-09 DIAGNOSIS — IMO0001 Reserved for inherently not codable concepts without codable children: Secondary | ICD-10-CM | POA: Insufficient documentation

## 2013-02-09 DIAGNOSIS — Z8679 Personal history of other diseases of the circulatory system: Secondary | ICD-10-CM | POA: Insufficient documentation

## 2013-02-09 DIAGNOSIS — I509 Heart failure, unspecified: Secondary | ICD-10-CM | POA: Insufficient documentation

## 2013-02-09 DIAGNOSIS — G20 Parkinson's disease: Secondary | ICD-10-CM | POA: Insufficient documentation

## 2013-02-09 DIAGNOSIS — G20A1 Parkinson's disease without dyskinesia, without mention of fluctuations: Secondary | ICD-10-CM | POA: Insufficient documentation

## 2013-02-09 DIAGNOSIS — F29 Unspecified psychosis not due to a substance or known physiological condition: Secondary | ICD-10-CM | POA: Insufficient documentation

## 2013-02-09 MED ORDER — SODIUM CHLORIDE 0.9 % IV BOLUS (SEPSIS)
1000.0000 mL | Freq: Once | INTRAVENOUS | Status: AC
  Administered 2013-02-10: 1000 mL via INTRAVENOUS

## 2013-02-09 MED ORDER — SODIUM CHLORIDE 0.9 % IV SOLN
Freq: Once | INTRAVENOUS | Status: AC
  Administered 2013-02-10: 75 mL/h via INTRAVENOUS

## 2013-02-09 NOTE — ED Provider Notes (Signed)
History  This chart was scribed for [employer]. Colon Branch, MD by Shari Heritage, ED Scribe. The patient was seen in room APA06/APA06. Patient's care was started at 2302.   CSN: [id number]  Arrival date & time 02/09/13  2235   First MD Initiated Contact with Patient 02/09/13 2302      Chief Complaint  Patient presents with  . Fall  . Altered Mental Status     The history is provided by a relative. No language interpreter was used.    HPI Comments - Level 5 Caveat - Unable to obtain complete history due to patient'S AMS: Lacey Gonzales is a 62 y.o. female with history of Parkinson's disease, congestive heart failure, COPD, mild thrombocytopenia, hypothyroidism who presents to the Emergency Department via EMS complaining of a fall that occurred immediately prior to arrival. She states that before getting into bed she took lorazepam, Lasix and heart medications. Relative also reports that patient has been confused for the past several days. Patient saw her PCP earlier today and was started on Lasix due to patient's recurrent leg swelling. Patient's thyroid was tested, but results have not returned yet. Patient had a CBG of 328 during visit. Patient is a current smoker, does use alcohol.  PCP Dr. Phillips Odor  Past Medical History  Diagnosis Date  . CHF (congestive heart failure)   . Fibromyalgia   . Depression   . Parkinson's disease   . Anemia   . COPD (chronic obstructive pulmonary disease)   . Iron deficiency anemia 01/21/2012  . Mild Thrombocytopenia 01/21/2012  . Bipolar disorder 01/21/2012  . Hypothyroidism 01/21/2012    S/P I 131 therapy  . Fibromyalgia 01/21/2012  . Enlarged heart     Past Surgical History  Procedure Laterality Date  . Abdominal hysterectomy    . Cholecystectomy    . Exploratory laparotomy    . Colonoscopy  06/11/2011    Procedure: COLONOSCOPY;  Surgeon: Malissa Hippo, MD;  Location: [facility];  Service: Endoscopy;  Laterality: N/A;    Family History  Problem  Relation Age of Onset  . Cancer Father   . Depression Father   . Coronary artery disease Father     History  Substance Use Topics  . Smoking status: Current Every Day Smoker -- 0.50 packs/day for 40 years    Types: Cigarettes  . Smokeless tobacco: Not on file  . Alcohol Use: No    OB History   Grav Para Term Preterm Abortions TAB SAB Ect Mult Living                  Review of Systems  Constitutional: Negative for fever.       10 Systems reviewed and are negative for acute change except as noted in the HPI.  HENT: Negative for congestion.   Eyes: Negative for discharge and redness.  Respiratory: Negative for cough and shortness of breath.   Cardiovascular: Negative for chest pain.  Gastrointestinal: Negative for vomiting and abdominal pain.  Musculoskeletal: Negative for back pain.  Skin: Negative for rash.  Neurological: Negative for syncope, numbness and headaches.     Allergies  Sulfur  Home Medications   Current Outpatient Rx  Name  Route  Sig  Dispense  Refill  . albuterol (PROVENTIL) (5 MG/ML) 0.5% nebulizer solution   Nebulization   Take 2.5 mg by nebulization every 6 (six) hours as needed.         . ALPRAZolam (XANAX) 0.5 MG tablet   Oral  Take 0.5 mg by mouth 3 (three) times daily.           . benzonatate (TESSALON) 100 MG capsule   Oral   Take 100 mg by mouth 3 (three) times daily as needed. For cough         . carvedilol (COREG) 25 MG tablet   Oral   Take 25 mg by mouth 2 (two) times daily with a meal.           . doxepin (SINEQUAN) 10 MG capsule   Oral   Take 10 mg by mouth 2 (two) times daily.          Marland Kitchen esomeprazole (NEXIUM) 40 MG capsule   Oral   Take 40 mg by mouth 2 (two) times daily.           . fish oil-omega-3 fatty acids 1000 MG capsule   Oral   Take 1 g by mouth daily.          Marland Kitchen guaiFENesin-codeine (ROBITUSSIN AC) 100-10 MG/5ML syrup   Oral   Take 5 mLs by mouth.           Marland Kitchen HYDROcodone-acetaminophen  (VICODIN) 5-500 MG per tablet   Oral   Take 1 tablet by mouth every 6 (six) hours as needed. For pain         . ipratropium (ATROVENT HFA) 17 MCG/ACT inhaler   Inhalation   Inhale 2 puffs into the lungs as needed.         . iron polysaccharides (NIFEREX) 150 MG capsule   Oral   Take 1 capsule (150 mg total) by mouth daily.   30 capsule   4   . levothyroxine (SYNTHROID, LEVOTHROID) 150 MCG tablet   Oral   Take 150 mcg by mouth daily.         . meloxicam (MOBIC) 15 MG tablet   Oral   Take 15 mg by mouth daily.           . Multiple Vitamins-Minerals (CENTRUM PO)   Oral   Take 1 tablet by mouth daily.         . promethazine (PHENERGAN) 25 MG tablet   Oral   Take 25 mg by mouth every 6 (six) hours as needed. For nausea         . sertraline (ZOLOFT) 50 MG tablet   Oral   Take 50 mg by mouth daily.           . trihexyphenidyl (ARTANE) 2 MG tablet   Oral   Take 2 mg by mouth 3 (three) times daily with meals.           Marland Kitchen zolpidem (AMBIEN CR) 12.5 MG CR tablet   Oral   Take 12.5 mg by mouth at bedtime as needed. For sleep           Triage Vitals: BP 112/63  Pulse 85  Temp(Src) 98.2 F (36.8 C) (Oral)  Resp 20  Ht 4\' 11"  (1.499 m)  Wt 159 lb (72.122 kg)  BMI 32.1 kg/m2  SpO2 98%  Physical Exam  Constitutional: She appears well-developed and well-nourished.  HENT:  Head: Normocephalic and atraumatic.  Mouth is dry.  Eyes: Conjunctivae and EOM are normal. Pupils are equal, round, and reactive to light.  Neck: Normal range of motion.  Cardiovascular: Normal rate, regular rhythm and normal heart sounds.   Pulmonary/Chest: Effort normal and breath sounds normal.  Musculoskeletal: She exhibits edema.  Neurological: She is alert.  Tangential, unconnected thoughts. Unclear whether related to medicines taken prior to arrival.   Skin: Skin is warm and dry.    ED Course  Procedures (including critical care time) Results for orders placed during the  hospital encounter of 02/09/13  CBC WITH DIFFERENTIAL      Result Value Range   WBC 6.5  4.0 - 10.5 K/uL   RBC 3.83 (*) 3.87 - 5.11 MIL/uL   Hemoglobin 11.4 (*) 12.0 - 15.0 g/dL   HCT 16.1 (*) 09.6 - 04.5 %   MCV 89.6  78.0 - 100.0 fL   MCH 29.8  26.0 - 34.0 pg   MCHC 33.2  30.0 - 36.0 g/dL   RDW 40.9  81.1 - 91.4 %   Platelets 88 (*) 150 - 400 K/uL   Neutrophils Relative 58  43 - 77 %   Neutro Abs 3.8  1.7 - 7.7 K/uL   Lymphocytes Relative 30  12 - 46 %   Lymphs Abs 2.0  0.7 - 4.0 K/uL   Monocytes Relative 10  3 - 12 %   Monocytes Absolute 0.6  0.1 - 1.0 K/uL   Eosinophils Relative 2  0 - 5 %   Eosinophils Absolute 0.1  0.0 - 0.7 K/uL   Basophils Relative 0  0 - 1 %   Basophils Absolute 0.0  0.0 - 0.1 K/uL  BASIC METABOLIC PANEL      Result Value Range   Sodium 138  135 - 145 mEq/L   Potassium 3.6  3.5 - 5.1 mEq/L   Chloride 101  96 - 112 mEq/L   CO2 24  19 - 32 mEq/L   Glucose, Bld 96  70 - 99 mg/dL   BUN 24 (*) 6 - 23 mg/dL   Creatinine, Ser 7.82  0.50 - 1.10 mg/dL   Calcium 8.9  8.4 - 95.6 mg/dL   GFR calc non Af Amer 70 (*) >90 mL/min   GFR calc Af Amer 81 (*) >90 mL/min  URINE RAPID DRUG SCREEN (HOSP PERFORMED)      Result Value Range   Opiates NONE DETECTED  NONE DETECTED   Cocaine NONE DETECTED  NONE DETECTED   Benzodiazepines POSITIVE (*) NONE DETECTED   Amphetamines NONE DETECTED  NONE DETECTED   Tetrahydrocannabinol NONE DETECTED  NONE DETECTED   Barbiturates NONE DETECTED  NONE DETECTED  URINALYSIS, ROUTINE W REFLEX MICROSCOPIC      Result Value Range   Color, Urine YELLOW  YELLOW   APPearance CLEAR  CLEAR   Specific Gravity, Urine 1.015  1.005 - 1.030   pH 6.0  5.0 - 8.0   Glucose, UA NEGATIVE  NEGATIVE mg/dL   Hgb urine dipstick NEGATIVE  NEGATIVE   Bilirubin Urine NEGATIVE  NEGATIVE   Ketones, ur NEGATIVE  NEGATIVE mg/dL   Protein, ur NEGATIVE  NEGATIVE mg/dL   Urobilinogen, UA 0.2  0.0 - 1.0 mg/dL   Nitrite NEGATIVE  NEGATIVE   Leukocytes, UA  NEGATIVE  NEGATIVE   Ct Head Wo Contrast  02/10/2013  *RADIOLOGY REPORT*  Clinical Data:  Headache and neck pain after fall.  Confusion for several days.  CT HEAD WITHOUT CONTRAST CT CERVICAL SPINE WITHOUT CONTRAST  Technique:  Multidetector CT imaging of the head and cervical spine was performed following the standard protocol without intravenous contrast.  Multiplanar CT image reconstructions of the cervical spine were also generated.  Comparison:  MRI brain 12/28/2008.  CT head 12/28/2008.  CT HEAD  Findings: Mild cerebral atrophy is  somewhat asymmetrically more prominent on the left.  Mild ventricular dilatation consistent with central atrophy.  No mass effect or midline shift.  No abnormal extra-axial fluid collections.  Gray-white matter junctions are distinct.  Basal cisterns are not effaced.  No evidence of acute intracranial hemorrhage.  No depressed skull fractures.  Visualized paranasal sinuses are not opacified.  Partial opacification of mastoid air cells bilaterally.  IMPRESSION: No acute intracranial abnormalities.  CT CERVICAL SPINE  Findings: Normal alignment of the cervical vertebrae and facet joints.  Degenerative changes in the cervical spine with narrowed cervical interspaces and endplate hypertrophic changes. Degenerative changes at C1-2 and throughout the facet joints.  No vertebral compression deformities.  Lateral masses of C1 appear symmetrical.  Odontoid process appears intact.  No focal bone lesion or bone destruction.  Bone cortex and trabecular architecture appear intact.  No prevertebral soft tissue swelling. Vascular calcifications in the cervical carotid arteries.  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  No displaced cervical fractures identified.   Original Report Authenticated By: Burman Nieves, M.D.    Ct Cervical Spine Wo Contrast  02/10/2013  *RADIOLOGY REPORT*  Clinical Data:  Headache and neck pain after fall.  Confusion for several days.   CT HEAD WITHOUT CONTRAST CT CERVICAL SPINE WITHOUT CONTRAST  Technique:  Multidetector CT imaging of the head and cervical spine was performed following the standard protocol without intravenous contrast.  Multiplanar CT image reconstructions of the cervical spine were also generated.  Comparison:  MRI brain 12/28/2008.  CT head 12/28/2008.  CT HEAD  Findings: Mild cerebral atrophy is somewhat asymmetrically more prominent on the left.  Mild ventricular dilatation consistent with central atrophy.  No mass effect or midline shift.  No abnormal extra-axial fluid collections.  Gray-white matter junctions are distinct.  Basal cisterns are not effaced.  No evidence of acute intracranial hemorrhage.  No depressed skull fractures.  Visualized paranasal sinuses are not opacified.  Partial opacification of mastoid air cells bilaterally.  IMPRESSION: No acute intracranial abnormalities.  CT CERVICAL SPINE  Findings: Normal alignment of the cervical vertebrae and facet joints.  Degenerative changes in the cervical spine with narrowed cervical interspaces and endplate hypertrophic changes. Degenerative changes at C1-2 and throughout the facet joints.  No vertebral compression deformities.  Lateral masses of C1 appear symmetrical.  Odontoid process appears intact.  No focal bone lesion or bone destruction.  Bone cortex and trabecular architecture appear intact.  No prevertebral soft tissue swelling. Vascular calcifications in the cervical carotid arteries.  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  No displaced cervical fractures identified.   Original Report Authenticated By: Burman Nieves, M.D.    DIAGNOSTIC STUDIES: Oxygen Saturation is 98% on room air, normal by my interpretation.    COORDINATION OF CARE: 11:20 PM- Patient informed of current plan for treatment and evaluation and agrees with plan at this time.         MDM  Patient with Parkinson's who fell at home tonight after  taking her nighttime medicines and is confused. Confusion is not consistent with dementia. It is more c/w having taken medications for sleep which she did. CT of head and cervical spine are normal. Labs are unremarkable. Will allow patient to stay in the ER until morning ans see if she is better.   7829 Patient has been on the call light all night, each time with a request or to get up. She has become more lucid as the night wore off. This morning  she is able to tell me that she took xanax before she went to bed last night, in the kitchen. Her daughter lives with her now to help her with her activities. She is getting a home aide in the next couple of days. I believe the confusion and tangential nature of her speech earlier in the evening is due to the medicines she takes. She will have breakfast and be discharged home.   I personally performed the services described in this documentation, which was scribed in my presence. The recorded information has been reviewed and considered.   MDM Reviewed: nursing note and vitals Interpretation: labs and CT scan           Nicoletta Dress. Colon Branch, MD 02/10/13 [phone]

## 2013-02-09 NOTE — ED Notes (Signed)
Daughter reports patient has fallen several times this week.  Found patient had taken her bag with medications to her room today but states patient denied taking any of them

## 2013-02-09 NOTE — ED Notes (Signed)
Also reported that patient went to [facility] today and received an injection to help get rid of the edema in her lower extremeties

## 2013-02-09 NOTE — ED Notes (Addendum)
EMS was called out for a fall when they arrived, pt was lying in the bed and got up walking. Pt has xanax missing from her bottle. EMS reports pt also c/o dysuria. Pt keeps saying, "I just got to get to bed." EMS also reported that pt saw her PCP today due to having fluid in her ankles and feet.

## 2013-02-10 LAB — RAPID URINE DRUG SCREEN, HOSP PERFORMED
Barbiturates: NOT DETECTED
Cocaine: NOT DETECTED
Tetrahydrocannabinol: NOT DETECTED

## 2013-02-10 LAB — URINALYSIS, ROUTINE W REFLEX MICROSCOPIC
Bilirubin Urine: NEGATIVE
Leukocytes, UA: NEGATIVE
Nitrite: NEGATIVE
Specific Gravity, Urine: 1.015 (ref 1.005–1.030)
Urobilinogen, UA: 0.2 mg/dL (ref 0.0–1.0)
pH: 6 (ref 5.0–8.0)

## 2013-02-10 LAB — CBC WITH DIFFERENTIAL/PLATELET
Basophils Absolute: 0 10*3/uL (ref 0.0–0.1)
Eosinophils Absolute: 0.1 10*3/uL (ref 0.0–0.7)
Eosinophils Relative: 2 % (ref 0–5)
HCT: 34.3 % — ABNORMAL LOW (ref 36.0–46.0)
MCH: 29.8 pg (ref 26.0–34.0)
MCV: 89.6 fL (ref 78.0–100.0)
Monocytes Absolute: 0.6 10*3/uL (ref 0.1–1.0)
Platelets: 88 10*3/uL — ABNORMAL LOW (ref 150–400)
RDW: 13.3 % (ref 11.5–15.5)

## 2013-02-10 LAB — BASIC METABOLIC PANEL
Calcium: 8.9 mg/dL (ref 8.4–10.5)
Creatinine, Ser: 0.88 mg/dL (ref 0.50–1.10)
GFR calc non Af Amer: 70 mL/min — ABNORMAL LOW (ref >90)
Glucose, Bld: 96 mg/dL (ref 70–99)
Sodium: 138 mEq/L (ref 135–145)

## 2013-02-10 MED ORDER — IBUPROFEN 400 MG PO TABS
600.0000 mg | ORAL_TABLET | Freq: Once | ORAL | Status: DC
  Filled 2013-02-10: qty 2

## 2013-02-10 NOTE — ED Notes (Signed)
Placed on bedpan at patient request to void - did not void

## 2013-02-10 NOTE — ED Notes (Signed)
Per Dr. Beatrice Lecher office other pt contact numbers are as follows: Daughter Cell [phone], POA [phone].

## 2013-02-10 NOTE — ED Notes (Signed)
Up to bedside commode with assistance. Very unsteady, unable to void.  Patient had to be instructed on how to get back onto the stretcher - confused.

## 2013-02-10 NOTE — ED Notes (Signed)
In morning report, pt is to be d/c once she eats breakfast. Pt given meal. Pt contact called. No answer. Will try again later.

## 2013-02-10 NOTE — ED Notes (Signed)
Dr. Phillips Odor is pt PCP. Dr. Phillips Odor office called and other contact numbers for pt was given. Got in touch with POA. POA stated would call Daughter and get her to come and pick up pt. Pt aware and verbalized understanding. nad noted.

## 2013-02-10 NOTE — ED Notes (Signed)
Attempted to call pt daughter x2. No answer.

## 2013-02-10 NOTE — ED Notes (Signed)
Patient asking for her heart medicine, asked about when she will get pain medicine.  Advised her it is middle of the night, 05:00am and her heart medicine is not due now.

## 2013-02-10 NOTE — ED Notes (Signed)
Remains confused, puts on call light then cannot remember why she placed the call.  Did take Sprite po.

## 2013-02-10 NOTE — ED Notes (Signed)
Counted remaining Xanax prior to daughter taking medication home - RX for 120 tabs  64 remain - should have taken 20 tabs over five days   24 tablets not accounted for - patient possibly taking more often than prescribed.

## 2013-02-10 NOTE — ED Notes (Signed)
Patient requesting to use the bathroom - placed on bedpan

## 2013-02-10 NOTE — ED Notes (Signed)
Off bedpan - did not void

## 2013-02-10 NOTE — ED Notes (Signed)
Patient states she needs to void - placed on bedpan - did not void.

## 2013-02-14 ENCOUNTER — Other Ambulatory Visit (HOSPITAL_COMMUNITY): Payer: Self-pay | Admitting: Oncology

## 2013-02-14 ENCOUNTER — Encounter (HOSPITAL_COMMUNITY): Payer: Self-pay | Admitting: Oncology

## 2013-02-14 DIAGNOSIS — D509 Iron deficiency anemia, unspecified: Secondary | ICD-10-CM

## 2013-02-14 MED ORDER — POLYSACCHARIDE IRON COMPLEX 150 MG PO CAPS: 150.0000 mg | ORAL_CAPSULE | Freq: Every day | ORAL | Status: DC

## 2013-03-08 ENCOUNTER — Encounter: Payer: Self-pay | Admitting: *Deleted

## 2013-03-09 ENCOUNTER — Ambulatory Visit: Payer: Self-pay | Admitting: Neurology

## 2013-03-09 ENCOUNTER — Emergency Department: Payer: Self-pay

## 2013-03-09 LAB — BASIC METABOLIC PANEL
Calcium, Total: 9.3 mg/dL (ref 8.5–10.1)
Creatinine: 1.04 mg/dL (ref 0.60–1.30)
EGFR (African American): >60
EGFR (Non-African Amer.): 58 — ABNORMAL LOW
Glucose: 95 mg/dL (ref 65–99)
Osmolality: 277 (ref 275–301)
Sodium: 137 mmol/L (ref 136–145)

## 2013-03-09 LAB — URINALYSIS, COMPLETE
Glucose,UR: NEGATIVE mg/dL (ref 0–75)
Ketone: NEGATIVE
Leukocyte Esterase: NEGATIVE
Nitrite: NEGATIVE
Specific Gravity: 1.017 (ref 1.003–1.030)
Squamous Epithelial: 2

## 2013-03-09 LAB — CBC
HGB: 12.3 g/dL (ref 12.0–16.0)
MCH: 30.2 pg (ref 26.0–34.0)
MCHC: 35.4 g/dL (ref 32.0–36.0)
MCV: 85 fL (ref 80–100)
Platelet: 155 10*3/uL (ref 150–440)
RDW: 13.4 % (ref 11.5–14.5)
WBC: 7 10*3/uL (ref 3.6–11.0)

## 2013-03-11 LAB — URINE CULTURE

## 2013-03-15 ENCOUNTER — Encounter (HOSPITAL_COMMUNITY): Payer: Self-pay | Admitting: Oncology

## 2013-03-15 ENCOUNTER — Encounter: Payer: Self-pay | Admitting: Neurology

## 2013-03-15 DIAGNOSIS — F329 Major depressive disorder, single episode, unspecified: Secondary | ICD-10-CM | POA: Insufficient documentation

## 2013-03-15 DIAGNOSIS — M47812 Spondylosis without myelopathy or radiculopathy, cervical region: Secondary | ICD-10-CM

## 2013-03-15 DIAGNOSIS — F411 Generalized anxiety disorder: Secondary | ICD-10-CM | POA: Insufficient documentation

## 2013-03-15 DIAGNOSIS — G20C Parkinsonism, unspecified: Secondary | ICD-10-CM | POA: Insufficient documentation

## 2013-03-15 DIAGNOSIS — IMO0001 Reserved for inherently not codable concepts without codable children: Secondary | ICD-10-CM | POA: Insufficient documentation

## 2013-03-15 DIAGNOSIS — D518 Other vitamin B12 deficiency anemias: Secondary | ICD-10-CM | POA: Insufficient documentation

## 2013-03-15 DIAGNOSIS — G20 Parkinson's disease: Secondary | ICD-10-CM

## 2013-03-15 DIAGNOSIS — G252 Other specified forms of tremor: Secondary | ICD-10-CM | POA: Insufficient documentation

## 2013-03-16 ENCOUNTER — Encounter: Payer: Self-pay | Admitting: Neurology

## 2013-03-16 ENCOUNTER — Ambulatory Visit (INDEPENDENT_AMBULATORY_CARE_PROVIDER_SITE_OTHER): Payer: Medicare Other | Admitting: Neurology

## 2013-03-16 VITALS — BP 122/68 | HR 79 | Temp 98.0°F | Ht 64.0 in | Wt 160.0 lb

## 2013-03-16 DIAGNOSIS — F329 Major depressive disorder, single episode, unspecified: Secondary | ICD-10-CM

## 2013-03-16 DIAGNOSIS — G20 Parkinson's disease: Secondary | ICD-10-CM

## 2013-03-16 MED ORDER — CARBIDOPA-LEVODOPA CR 25-100 MG PO TBCR: 1.0000 | EXTENDED_RELEASE_TABLET | Freq: Four times a day (QID) | ORAL | Status: DC

## 2013-03-16 NOTE — Patient Instructions (Addendum)
I think overall you are doing fairly well but I do want to suggest a few things today:  Remember to drink plenty of fluid, eat healthy meals and do not skip any meals. Try to eat protein with a every meal and eat a healthy snack such as fruit or nuts in between meals. Try to keep a regular sleep-wake schedule and try to exercise daily, particularly in the form of walking, 20-30 minutes a day, if you can.   Engage in social activities in your community and with your family and try to keep up with current events by reading the newspaper or watching the news.   As far as your medications are concerned, I would like to suggest reducing and eliminating your vicodin and reduce Xanax. I will increase your sinemet CR 25/100 mg to 4 times a day: 8 AM, 12, 4PM and 8PM.   I would like to see you back in 3 months, sooner if we need to. Please call us with any interim questions, concerns, problems, updates or refill requests.  Please also call us for any test results so we can go over those with you on the phone. Brett Canales is my clinical assistant and will answer any of your questions and relay your messages to me and also relay most of my messages to you.  Our phone number is [phone]. We also have an after hours call service for urgent matters and there is a physician on-call for urgent questions. For any emergencies you know to call 911 or go to the nearest emergency room.   We will put in a referral to Psychiatry.

## 2013-03-16 NOTE — Progress Notes (Signed)
Subjective:    Patient ID: Lacey Gonzales is a 62 y.o. female.  HPI  Interim history:   Lacey Gonzales is a very pleasant 62 year-old right-handed lady who presents for follow-up consultation of her Parkinson's disease. She is accompanied by her POA, [name] and her daughter today. This is her first visit with me and she previously followed by Dr. Sandria Manly and was last seen by him on 12/09/12 at which time he advised her to stop smoking and increased her C/L CR to 1 pill tid, at 8-9, 1-2 and around 7 PM. He increased her Savella to 50 mg bid. He considered Aricept for memory loss. Her falls assessment tool score was 23 at the time.  She has a underlying medical Hx of heart d/s with CHF, migraine, hypertension, COPD, thyroid disease, FMS, depression, anxiety and RLS.  Her current medications are: Savella, synthroid, Carvedilol, Sinemet, Meloxicam, promethazine, Ambien, Artane 2 mg 1/2 tid, Xanax 1 mg 1/2 tid, Silenor.  Her daughter moved in with her in February. She had a FU with her PCP last week and with her heart doctor last month. She has been having LE swelling and cramping in her calves. She has had some confusion. She fell in April after accidentally taking too much Xanax and EMS was called and she was taken to [facility] in [place], where she was kept overnight and per daughter says she had a CTH, which was neg. her daughter did not give her any of her medications this morning and states that they were supposed to go social services to ask for more help at the house and she did not want the patient to be "loopy":  I reviewed Dr. Imagene Gurney notes and her records and below is a summary of that review:   62 yo RH female with a longstanding history of tremor and a family history of tremor, who was diagnosed with PD in 2/11. MRI brain on 01/03/10 showed cortical atrophy. She was started on artane in 3/11. She does not drive. Her sister had PD as well. In 2/13 her MMSE was 20, CDT was 2/4 and AFT was  12. She was started on C/L in 2/13. She uses a walker for ambulation. In April 2013 her MMSE was 25. She needs help with her ADLs. Her husband passed away in 2022-12-10 last year. Since then she had a power of attorney. In 12-10-22 this year her MMSE was 20, clock drawing was 1, animal fluency was 8.   Her Past Medical History Is Significant For: Past Medical History  Diagnosis Date  . CHF (congestive heart failure)   . Fibromyalgia   . Depression   . Parkinson's disease   . Anemia   . COPD (chronic obstructive pulmonary disease)   . Iron deficiency anemia 01/21/2012  . Mild Thrombocytopenia 01/21/2012  . Bipolar disorder 01/21/2012  . Hypothyroidism 01/21/2012    S/P I 131 therapy  . Fibromyalgia 01/21/2012  . Enlarged heart   . GERD (gastroesophageal reflux disease)   . Anxiety     Her Past Surgical History Is Significant For: Past Surgical History  Procedure Laterality Date  . Abdominal hysterectomy    . Cholecystectomy    . Exploratory laparotomy    . Colonoscopy  06/11/2011    Procedure: COLONOSCOPY;  Surgeon: Malissa Hippo, MD;  Location: [facility];  Service: Endoscopy;  Laterality: N/A;  . Cardiac catheterization  12/30/2008    showed normal coronaries and monitor which showed some sinus tachycardia  and infrequent PACs.    Her Family History Is Significant For: Family History  Problem Relation Age of Onset  . Cancer Father   . Depression Father   . Coronary artery disease Father   . Aneurysm Father   . Hypertension Mother   . Cancer Mother   . Heart attack Brother     Her Social History Is Significant For: History   Social History  . Marital Status: Widowed    Spouse Name: N/A    Number of Children: 2  . Years of Education: college   Occupational History  .      Disabled   Social History Main Topics  . Smoking status: Former Smoker -- 0.50 packs/day for 40 years    Types: Cigarettes    Quit date: 11/11/2011  . Smokeless tobacco: Not on file  . Alcohol  Use: No  . Drug Use: No  . Sexually Active: Not on file   Other Topics Concern  . Not on file   Social History Narrative   Pt lives at home with daughter.   Caffeine Use: 2-3 cups weekly    Her Allergies Are:  Allergies  Allergen Reactions  . Sulfur   :   Her Current Medications Are:  Outpatient Encounter Prescriptions as of 03/16/2013  Medication Sig Dispense Refill  . ALPRAZolam (XANAX) 0.5 MG tablet Take 0.5 mg by mouth 4 (four) times daily as needed.       . benzonatate (TESSALON) 100 MG capsule Take 100 mg by mouth 3 (three) times daily as needed. For cough      . carbidopa-levodopa (SINEMET CR) 25-100 MG per tablet Take 1 tablet by mouth 4 (four) times daily. Take at 8AM, 12, 4PM and 8PM. Please deliver to pt.  120 tablet  5  . carvedilol (COREG) 25 MG tablet Take 25 mg by mouth 2 (two) times daily with a meal.        . doxepin (SINEQUAN) 10 MG capsule Take 10 mg by mouth 2 (two) times daily.       . eszopiclone (LUNESTA) 2 MG TABS Take 3 mg by mouth at bedtime. Take immediately before bedtime      . fish oil-omega-3 fatty acids 1000 MG capsule Take 1 g by mouth daily.       Marland Kitchen HYDROcodone-acetaminophen (VICODIN) 5-500 MG per tablet Take 1 tablet by mouth every 6 (six) hours as needed. For pain      . ipratropium (ATROVENT HFA) 17 MCG/ACT inhaler Inhale 2 puffs into the lungs as needed.      . iron polysaccharides (NIFEREX) 150 MG capsule Take 1 capsule (150 mg total) by mouth daily.  30 capsule  0  . levothyroxine (SYNTHROID, LEVOTHROID) 150 MCG tablet Take 150 mcg by mouth daily.      . meloxicam (MOBIC) 15 MG tablet Take 15 mg by mouth daily.        . Milnacipran (SAVELLA) 50 MG TABS Take 50 mg by mouth 2 (two) times daily.      . Multiple Vitamins-Minerals (CENTRUM PO) Take 1 tablet by mouth daily.      . promethazine (PHENERGAN) 25 MG tablet Take 25 mg by mouth every 6 (six) hours as needed. For nausea      . sertraline (ZOLOFT) 50 MG tablet Take 50 mg by mouth daily.         . trihexyphenidyl (ARTANE) 2 MG tablet Take 2 mg by mouth 3 (three) times daily with meals.        . [  DISCONTINUED] carbidopa-levodopa (SINEMET CR) 25-100 MG per tablet Take 1 tablet by mouth 2 (two) times daily.      . [DISCONTINUED] albuterol (PROVENTIL) (5 MG/ML) 0.5% nebulizer solution Take 2.5 mg by nebulization every 6 (six) hours as needed.      . [DISCONTINUED] esomeprazole (NEXIUM) 40 MG capsule Take 40 mg by mouth 2 (two) times daily.        . [DISCONTINUED] guaiFENesin-codeine (ROBITUSSIN AC) 100-10 MG/5ML syrup Take 5 mLs by mouth.        . [DISCONTINUED] omeprazole (PRILOSEC) 20 MG capsule Take 20 mg by mouth daily.      . [DISCONTINUED] zolpidem (AMBIEN CR) 12.5 MG CR tablet Take 12.5 mg by mouth at bedtime as needed. For sleep       No facility-administered encounter medications on file as of 03/16/2013.  : Review of Systems  Constitutional: Positive for unexpected weight change.       Weight loss, fatigue  Eyes: Positive for visual disturbance.       Blurred vision, loss of vision  Respiratory: Positive for cough and shortness of breath.        Snoring  Cardiovascular: Positive for chest pain and palpitations.       Swelling legs  Endocrine:       Feeling cold, increased thirst  Musculoskeletal: Positive for joint swelling.       Joint pain, cramps, aching muscles  Neurological: Positive for dizziness, tremors, weakness and headaches.       Memory loss, confusion, passing out   Hematological: Bruises/bleeds easily.       Anemia  Psychiatric/Behavioral:       Insomnia, sleepiness, snoring, restless legs, depression, anxiety, too much sleep, decreased energy, change in appetite, hallucinations, racing thoughts.     Objective:  Neurologic Exam  Physical Exam Physical Examination:   Filed Vitals:   03/16/13 1441  BP: 122/68  Pulse: 79  Temp: 98 F (36.7 C)    General Examination: The patient is a very pleasant 62 y.o. female in no acute distress.  HEENT:  Normocephalic, atraumatic, pupils are equal, round and reactive to light and accommodation. Funduscopic exam is normal with sharp disc margins noted. Extraocular tracking shows moderate saccadic breakdown without nystagmus noted. There is limitation to upper gaze. There is moderate decrease in eye blink rate. Hearing is intact. Face is symmetric with moderate facial masking and normal facial sensation. There is no lip, neck or jaw tremor. Neck is severely rigid with intact passive ROM. There are no carotid bruits on auscultation. Oropharynx exam reveals moderate mouth dryness. No significant airway crowding is noted. Mallampati is class II. Tongue protrudes centrally and palate elevates symmetrically.   There is no drooling.   Chest: is clear to auscultation without wheezing, rhonchi or crackles noted.  Heart: sounds are regular and normal without murmurs, rubs or gallops noted.   Abdomen: is soft, non-tender and non-distended with normal bowel sounds appreciated on auscultation.  Extremities: There is 1-2+ edema in the distal lower extremities bilaterally with chronic stasis-like changes noted.  Skin: is warm and dry with no trophic changes noted.  Musculoskeletal: exam reveals no obvious joint deformities, tenderness or joint swelling or erythema.  Neurologically:  Mental status: The patient is awake and alert, paying fair  attention. She is able to partially provide the history. Her daughter and POA provide details. She is oriented to: person, place, situation and month of year. Her memory, attention, language and knowledge are impaired. There is no aphasia,  agnosia, apraxia or anomia. There is a moderate degree of bradyphrenia. Speech is moderately hypophonic with mild dysarthria noted. Mood is congruent and affect is blunted.  Cranial nerves are as described above under HEENT exam. In addition, shoulder shrug is normal with equal shoulder height noted.  Motor exam: Normal bulk, and strength  for age is noted. There are no dyskinesias noted. Tone is moderately rigid with absence of cogwheeling in the bilateral upper extremity. There is overall moderate bradykinesia. There is no drift or rebound. There is no tremor. Romberg is negative. Reflexes are 1+ in the upper extremities and 1+ in the lower extremities. Toes are downgoing bilaterally. Fine motor skills exam reveals: Finger taps are moderately impaired on the right and moderately impaired on the left. Hand movements are moderately impaired on the right and moderately impaired on the left. RAP (rapid alternating patting) is moderately impaired on the right and moderately impaired on the left. Foot taps are severely impaired on the right and severely impaired on the left. Foot agility (in the form of heel stomping) is severely impaired on the right and severely impaired on the left.    Cerebellar testing shows no dysmetria or intention tremor on finger to nose testing. Heel to shin is unremarkable bilaterally. There is no truncal or gait ataxia.   Sensory exam is intact to light touch, pinprick, vibration, temperature sense and proprioception in the upper and lower extremities.   Gait, station and balance exan: She stands up from the seated position with moderate difficulty and needs some assistance. No veering to one side is noted. She is not noted to lean to the side. Posture is mildly stooped. Stance is wide-based. She walks with decrease in stride length and pace and decreased arm swing. She turns in 3 steps. Tandem walk is not possible. Balance is moderately impaired. She is not able to do a toe or heel stance.     Assessment and Plan:   Assessment and Plan:  In summary, RENNE CORNICK is a very pleasant 62 y.o.-year old female with a history of Parkinson's disease and memory loss. She has a history of depression and anxiety. I really worry about her taking this many sedating medications. She is on Vicodin about 3 times a day at this  moment and Xanax about 3 times a day as well is taking Lunesta at night and Zoloft as well as Savella. I talked to her and her caretakers at length about this today I do believe she is under medicated clinic comes to Sinemet. Then again, today she did not take any of her medications and in variably would appear under medicated. Nevertheless at this juncture, I would like to increase her Sinemet CR to 4 times a day, namely at 8, 12, 4 PM and 8 PM. I would like for her to eliminate gradually her Vicodin as well as reduce her Xanax use. She would benefit from a psychiatry evaluation and treatment. To that end I made a referral today. She would like to go to [facility] in [place]. I suggested that she see me back in 3 months from now, sooner if the need arises. She is advised to elevate her legs whenever possible. She is advised to drink more water. I answered all her questions and encouraged them   to call with any interim concerns or problems a refill requests, they were in agreement.

## 2013-04-06 ENCOUNTER — Telehealth: Payer: Self-pay | Admitting: Neurology

## 2013-04-08 ENCOUNTER — Telehealth: Payer: Self-pay | Admitting: Neurology

## 2013-04-08 DIAGNOSIS — G20 Parkinson's disease: Secondary | ICD-10-CM

## 2013-04-08 NOTE — Telephone Encounter (Signed)
I recommend a second opinion for re-evaluation of her diagnosis. Pls advise daughter in that regard. That way, she can be seen sooner and also keep FU appt with me.

## 2013-04-08 NOTE — Telephone Encounter (Signed)
Patient's daughter has been contacted regarding possible "misdiagnosis" ; the patient has been referred to [facility] for a 2nd opinion.

## 2013-04-13 ENCOUNTER — Telehealth: Payer: Self-pay | Admitting: *Deleted

## 2013-04-14 NOTE — Telephone Encounter (Signed)
I called and spoke to Lacey Gonzales and let her know that the referral sent.  I gave her the [phone] to their office.

## 2013-04-18 ENCOUNTER — Encounter (HOSPITAL_COMMUNITY): Payer: Self-pay | Admitting: Oncology

## 2013-04-28 ENCOUNTER — Encounter: Payer: Self-pay | Admitting: Neurology

## 2013-04-28 ENCOUNTER — Ambulatory Visit (INDEPENDENT_AMBULATORY_CARE_PROVIDER_SITE_OTHER): Payer: Medicare Other | Admitting: Neurology

## 2013-04-28 VITALS — BP 110/68 | HR 70 | Temp 97.7°F | Resp 12 | Ht 59.5 in | Wt 156.0 lb

## 2013-04-28 DIAGNOSIS — R441 Visual hallucinations: Secondary | ICD-10-CM

## 2013-04-28 DIAGNOSIS — H5316 Psychophysical visual disturbances: Secondary | ICD-10-CM

## 2013-04-28 DIAGNOSIS — G20 Parkinson's disease: Secondary | ICD-10-CM

## 2013-04-28 DIAGNOSIS — R110 Nausea: Secondary | ICD-10-CM

## 2013-04-28 NOTE — Progress Notes (Signed)
Lacey Gonzales was seen today in the movement disorders clinic for neurologic consultation at the request of Dr. Kenijah Furbish.  Her PCP is Colette Ribas, MD.  The consultation is for a second opinon regarding PD.  The pt is accompanied by her daughter who supplements the hx.  I did have the opportunity to review Dr. Johny Sax previous notes.  Prior to Dr. Henli Furbish, the patient saw Dr. Stann Mainland.  The patient was diagnosed with Parkinson's disease in February, 2011.  She was started on Artane in March 2011.  Levodopa was started in February, 2013.  Just last month (may, 2014) Dr. Loray Furbish increased her levodopa from 3 times a day to 4 times a day.  Her daughter notices no benefit whatsoever with the addition of levodopa.  The pt cannot remember what her first sx was.  Her daughter cannot remember either.  Her daughter is very clear, however, in stating that visual hallucinations began in 2008 and she did not seek care for parkinsonism until 2011.   Specific Symptoms:  Tremor: yes, R arm and leg Voice: hypophonic Sleep: trouble getting to sleep and staying  Vivid Dreams:  no  Acting out dreams:  no Wet Pillows: yes Postural symptoms:  yes  Falls?  yes (last fall in April, never any fx) Bradykinesia symptoms: difficulty with initiating movement, slowed thought processes, slow and small handwriting, reduced facial expressions, difficulty getting out of a chair and difficulty regaining balance Loss of smell:  yes Loss of taste:  yes Urinary Incontinence:  yes (x 2 years) Difficulty Swallowing:  yes Handwriting, micrographia: yes Trouble with ADL's:  yes  (assist with dressing, showers)  Trouble buttoning clothing: yes Depression:  yes (for many years prior to the dx of PD, and has dx of bipolar since 2008.  Just started with a new psychiatrist).  Was doing injections with last psychiatrist but been off for a year - ? Haldol Memory changes:  yes x 2 years, trouble remembering people, short term memory problems,  repeating herself, no longer driving x 2 years, wandering around outside with inappropriate behavior Hallucinations:  yes, visual, since 2008.  Had first nervous breakdown in 2008 and another in 2011 (never auditory; sees women in long dresses)  visual distortions: yes N/V:  yes (takes phenergan one time per day) Lightheaded:  yes  Syncope: no Diplopia:  no Dyskinesia:  ?    Review Dr. Teofilo Pod notes, I see that Dr. Telly Furbish did talk to them about the number of sedating medications that she was on including Vicodin 3 times a day Xanax 3 times a day, Lunesta, Zoloft and Savella.  She was given a referral to psychiatry last visit from Dr. Natassja Furbish.  Her daughter does state that she has decreased the Vicodin and Xanax from 3 times a day to 2 times a day dosing.    ALLERGIES:   Allergies  Allergen Reactions  . Sulfur     CURRENT MEDICATIONS:  Current Outpatient Prescriptions on File Prior to Visit  Medication Sig Dispense Refill  . ALPRAZolam (XANAX) 0.5 MG tablet Take 0.5 mg by mouth 4 (four) times daily as needed.       . benzonatate (TESSALON) 100 MG capsule Take 100 mg by mouth 3 (three) times daily as needed. For cough      . carbidopa-levodopa (SINEMET CR) 25-100 MG per tablet Take 1 tablet by mouth 4 (four) times daily. Take at 8AM, 12, 4PM and 8PM. Please deliver to pt.  120 tablet  5  .  carvedilol (COREG) 25 MG tablet Take 25 mg by mouth 2 (two) times daily with a meal.        . HYDROcodone-acetaminophen (VICODIN) 5-500 MG per tablet Take 1 tablet by mouth every 6 (six) hours as needed. For pain      . ipratropium (ATROVENT HFA) 17 MCG/ACT inhaler Inhale 2 puffs into the lungs as needed.      . iron polysaccharides (NIFEREX) 150 MG capsule Take 1 capsule (150 mg total) by mouth daily.  30 capsule  0  . levothyroxine (SYNTHROID, LEVOTHROID) 150 MCG tablet Take 150 mcg by mouth daily.      . meloxicam (MOBIC) 15 MG tablet Take 15 mg by mouth daily.        . Milnacipran (SAVELLA) 50 MG TABS  Take 50 mg by mouth 2 (two) times daily.      . Multiple Vitamins-Minerals (CENTRUM PO) Take 1 tablet by mouth daily.      . promethazine (PHENERGAN) 25 MG tablet Take 25 mg by mouth every 6 (six) hours as needed. For nausea      . sertraline (ZOLOFT) 50 MG tablet Take 50 mg by mouth daily.        . trihexyphenidyl (ARTANE) 2 MG tablet Take 2 mg by mouth 3 (three) times daily with meals.        Marland Kitchen doxepin (SINEQUAN) 10 MG capsule Take 10 mg by mouth 2 (two) times daily.       . eszopiclone (LUNESTA) 2 MG TABS Take 3 mg by mouth at bedtime. Take immediately before bedtime      . fish oil-omega-3 fatty acids 1000 MG capsule Take 1 g by mouth daily.        No current facility-administered medications on file prior to visit.    PAST MEDICAL HISTORY:   Past Medical History  Diagnosis Date  . CHF (congestive heart failure)   . Fibromyalgia   . Depression   . Parkinson's disease   . Anemia   . COPD (chronic obstructive pulmonary disease)   . Iron deficiency anemia 01/21/2012  . Mild Thrombocytopenia 01/21/2012  . Bipolar disorder 01/21/2012  . Hypothyroidism 01/21/2012    S/P I 131 therapy  . Fibromyalgia 01/21/2012  . Enlarged heart   . GERD (gastroesophageal reflux disease)   . Anxiety     PAST SURGICAL HISTORY:   Past Surgical History  Procedure Laterality Date  . Abdominal hysterectomy    . Cholecystectomy    . Exploratory laparotomy    . Colonoscopy  06/11/2011    Procedure: COLONOSCOPY;  Surgeon: Malissa Hippo, MD;  Location: [facility];  Service: Endoscopy;  Laterality: N/A;  . Cardiac catheterization  12/30/2008    showed normal coronaries and monitor which showed some sinus tachycardia and infrequent PACs.    SOCIAL HISTORY:   History   Social History  . Marital Status: Widowed    Spouse Name: N/A    Number of Children: 2  . Years of Education: college   Occupational History  .      Disabled   Social History Main Topics  . Smoking status: Current Every Day  Smoker -- 0.50 packs/day for 40 years    Types: Cigarettes    Last Attempt to Quit: 11/11/2011  . Smokeless tobacco: Never Used  . Alcohol Use: No  . Drug Use: No  . Sexually Active: Not on file   Other Topics Concern  . Not on file   Social History Narrative  Pt lives at home with daughter.   Caffeine Use: 2-3 cups weekly    FAMILY HISTORY:   Family Status  Relation Status Death Age  . Father Deceased 46    cerebral aneurysm  . Mother Deceased 75    CA, breast  . Sister Alive     2, healthy  . Brother Deceased     suicide  . Brother Alive     healthy  . Child Alive     2, DM, CAD    ROS:  A complete 10 system review of systems was obtained and was unremarkable apart from what is mentioned above.  PHYSICAL EXAMINATION:    VITALS:   Filed Vitals:   04/28/13 1233  BP: 110/68  Pulse: 70  Temp: 97.7 F (36.5 C)  Resp: 12  Height: 4' 11.5" (1.511 m)  Weight: 156 lb (70.761 kg)    GEN:  The patient appears stated age and is in NAD. HEENT:  Normocephalic, atraumatic.  The mucous membranes are moist. The superficial temporal arteries are without ropiness or tenderness. CV:  RRR Lungs:  CTAB Neck/HEME:  There are no carotid bruits bilaterally.  Neurological examination:  Orientation: The patient is alert and oriented x3. Fund of knowledge is appropriate.  Recent and remote memory are intact.  Attention and concentration are normal.    Able to name objects and repeat phrases.  She is very slow to do these things, however, and looks to her daughter for the more detailed aspects of the history.  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  She is agitated somewhat during the history and it gets up and down. Cranial nerves: There is good facial symmetry but there is severe facial hypomimia. Pupils are equal round and reactive to light bilaterally.  Fundoscopic exam is attempted but the disc margins are not well visualized bilaterally.  Extraocular muscles are intact.   There are square wave jerks.  The visual fields are full to confrontational testing. The speech is fluent and clear.  It is hypophonic and lacks spontaneity.  She has difficulty with the guttural sounds.  Soft palate rises symmetrically and there is no tongue deviation. Hearing is intact to conversational tone. Sensation: Sensation is intact to light and pinprick throughout (facial, trunk, extremities). Vibration is decreased distally.  She is very inconsistent with responses when testing double simultaneous stimulation.  There is no sensory dermatomal level identified.  Motor: Strength is at least antigravity x4.  There is give way weakness.  Strength does improve with encouragement. Deep tendon reflexes: Deep tendon reflexes are 2-/4 at the bilateral biceps, triceps, brachioradialis, patella and trace at the bilateral achilles. Plantar responses are downgoing bilaterally.  Movement examination: Tone: There is increased tone in the bilateral upper extremities, but there is a gegenhalten quality to some of it.  Tone increases with activation procedures. Abnormal movements: None Coordination:  There is definite decremation with RAM's, Including alternating supination and pronation of the forearm, hand opening and closing, finger taps, heel taps and toe taps bilaterally. Gait and Station: The patient has minor difficulty arising out of a deep-seated chair without the use of the hands. The patient's stride length is just slightly decreased.    ASSESSMENT/PLAN:  1.  Parkinsonism.  I discussed with the patient and her daughter that I am at a distinct disadvantage as I have not seen her since the beginning, and they do not remember her presenting symptoms.  I don not have early symptoms either.   However,  they do not remember that prominent hallucinations began at least 3 years prior to the onset of slowness and more traditional parkinsonian symptoms.  Hallucinations have become increasingly problematic.   They do not think that the levodopa helps.  Given prominent, early hallucinations followed by parkinsonism and memory loss, one must entertain the diagnosis of Lewy body dementia.  However, it does sound like the patient was on Haldol for several years which confuse the picture.  However, she has been off of it for about a year and tardive parkinsonism is incredibly rare.  -It would be my recommendation that she be taken off of the Artane, given memory changes and hallucinations and balance changes  -I. would recommend that her Phenergan be changed to Zofran, since she is taking Phenergan regularly and that can contribute to parkinsonism.  -I. think that Seroquel could potentially be of value.  This could help with not only hallucinations but also mood and insomnia.  -I. would recommend a modified barium evaluation, given the fact that she complains of dysphagia.  -I. agree with Dr. Brigitta Furbish that she is on other medications that could potentially interfere with cognitive function, especially, her Vicodin and Xanax.  Her daughter has been slowly weaning this. 2.  The patient and her daughter are going to return to Dr. Leann Furbish.  She has an appointment in August.  This was a second opinon eval only.   At their request, I will also send a copy of my notes to Dr. Janeece Riggers, her new psychiatrist in [place]. 3.  Face to face time was 60 min

## 2013-04-28 NOTE — Patient Instructions (Addendum)
1.  I would recommend talking with Dr. Tewana Furbish about the following:  -artane, can cause confusion  -possible referral for a swallow eval  -possible addition of medication such as seroquel

## 2013-06-22 ENCOUNTER — Encounter: Payer: Self-pay | Admitting: Neurology

## 2013-06-22 ENCOUNTER — Ambulatory Visit (INDEPENDENT_AMBULATORY_CARE_PROVIDER_SITE_OTHER): Payer: Medicare Other | Admitting: Neurology

## 2013-06-22 VITALS — BP 110/76 | HR 80 | Temp 97.9°F | Ht 60.0 in | Wt 168.0 lb

## 2013-06-22 DIAGNOSIS — Z9181 History of falling: Secondary | ICD-10-CM

## 2013-06-22 DIAGNOSIS — G20 Parkinson's disease: Secondary | ICD-10-CM

## 2013-06-22 DIAGNOSIS — R413 Other amnesia: Secondary | ICD-10-CM

## 2013-06-22 NOTE — Patient Instructions (Signed)
I do want to suggest a few things today:  Remember to drink plenty of fluid, eat healthy meals and do not skip any meals. Try to eat protein with a every meal and eat a healthy snack such as fruit or nuts in between meals. Try to keep a regular sleep-wake schedule and try to exercise daily, particularly in the form of walking, 20-30 minutes a day, if you can.   Engage in social activities in your community and with your family and try to keep up with current events by reading the newspaper or watching the news.   As far as your medications are concerned, I would like to suggest no changes.   As far as diagnostic testing: head CT.  I would like to see you back in 4 months, sooner if we need to. Please call us with any interim questions, concerns, problems, updates or refill requests.  Please also call us for any test results so we can go over those with you on the phone. Brett Canales is my clinical assistant and will answer any of your questions and relay your messages to me and also relay most of my messages to you.  Our phone number is [phone]. We also have an after hours call service for urgent matters and there is a physician on-call for urgent questions. For any emergencies you know to call 911 or go to the nearest emergency room.

## 2013-06-22 NOTE — Progress Notes (Signed)
Subjective:    Patient ID: Lacey Gonzales is a 62 y.o. female.  HPI  Interim history:   Lacey Gonzales is a 62 year-old right-handed lady who presents for follow-up consultation of her Parkinson's disease/parkinsonism. She is accompanied by her daughter today. I first met her on 03/16/2013, at which time I increased her Sinemet. I felt that sedating medications were contributor to her memory dysfunction. I increased her Sinemet CR to 4 times daily. In the interim her daughter called back and reported that there was no improvement on the increased dose of Sinemet. I suggested a second opinion with Lacey Gonzales. She saw the patient in June and felt that there may be concern for Lacey Gonzales, in the face of lack of efficacy with levodopa, history of memory loss and history of hallucinations which proceeded her parkinsonism. She advised the patient to stop Artane. She has since then stopped the Artane and her Psychiatrist, Lacey Gonzales started her on Seroquel 25 mg one in the afternoon and 2 at night. She has no prior Hx of taking antipsychotics. Her daughter reports improved hallucinations since the artane was stopped. Her hallucinations started around 2008, along with memory problems and her parkinsonism started in 2010.  She used to see Lacey Gonzales and was last seen by him on 12/09/12 at which time he advised her to stop smoking and increased her C/L to 1 pill tid. He increased her Savella to 50 mg bid. He considered Aricept for memory loss. Her falls assessment tool score was 23 at the time.  She has a underlying medical Hx of heart d/s with CHF, migraine, hypertension, COPD, thyroid disease, FMS, depression, anxiety, nicotine abuse and RLS.  Her current medications are: Savella, synthroid, Carvedilol, Sinemet, Meloxicam, promethazine, Ambien, Xanax 1 mg 1/2 tid, Silenor, seroquel.  She has a family history of tremor, who was diagnosed with PD in 2/11. MRI brain on 01/03/10 showed cortical atrophy. She was started on artane in  3/11. She does not drive. Her sister had PD as well. In 2/13 her MMSE was 20, CDT was 2/4 and AFT was 12. She was started on C/L in 2/13. She uses a walker for ambulation. In April 2013 her MMSE was 25. She needs help with her ADLs. Her husband passed away in 12/16/2022 last year. Since then she had a power of attorney. In 2022-12-16 this year her MMSE was 20, clock drawing was 1, animal fluency was 8. She had stopped smoking, but restarted and smokes 1/2 ppd.  She bumped her head while bending over about 5 days ago. She felt lightheaded at the time, but denies HA or blurry vision, no LOC. She has had more confusion and some behavioral changes. She needs help with her ADLs.   Her Past Medical History Is Significant For: Past Medical History  Diagnosis Date  . CHF (congestive heart failure)   . Fibromyalgia   . Depression   . Parkinson's disease   . Anemia   . COPD (chronic obstructive pulmonary disease)   . Iron deficiency anemia 01/21/2012  . Mild Thrombocytopenia 01/21/2012  . Bipolar disorder 01/21/2012  . Hypothyroidism 01/21/2012    S/P I 131 therapy  . Fibromyalgia 01/21/2012  . Enlarged heart   . GERD (gastroesophageal reflux disease)   . Anxiety     Her Past Surgical History Is Significant For: Past Surgical History  Procedure Laterality Date  . Abdominal hysterectomy    . Cholecystectomy    . Exploratory laparotomy    . Colonoscopy  06/11/2011    Procedure: COLONOSCOPY;  Surgeon: Lacey Hippo, MD;  Location: [facility];  Service: Endoscopy;  Laterality: N/A;  . Cardiac catheterization  12/30/2008    showed normal coronaries and monitor which showed some sinus tachycardia and infrequent PACs.    Her Family History Is Significant For: Family History  Problem Relation Age of Onset  . Cancer Father   . Depression Father   . Coronary artery disease Father   . Aneurysm Father   . Hypertension Mother   . Cancer Mother   . Heart attack Brother     Her Social History Is  Significant For: History   Social History  . Marital Status: Widowed    Spouse Name: N/A    Number of Children: 2  . Years of Education: college   Occupational History  .      Disabled   Social History Main Topics  . Smoking status: Current Every Day Smoker -- 0.50 packs/day for 40 years    Types: Cigarettes    Last Attempt to Quit: 11/11/2011  . Smokeless tobacco: Never Used  . Alcohol Use: No  . Drug Use: No  . Sexual Activity: None   Other Topics Concern  . None   Social History Narrative   Lacey Gonzales lives at home with daughter.   Caffeine Use: 2-3 cups weekly    Her Allergies Are:  Allergies  Allergen Reactions  . Sulfur   :   Her Current Medications Are:  Outpatient Encounter Prescriptions as of 06/22/2013  Medication Sig Dispense Refill  . ALPRAZolam (XANAX) 0.5 MG tablet Take 0.5 mg by mouth 4 (four) times daily as needed.       . benzonatate (TESSALON) 100 MG capsule Take 100 mg by mouth 3 (three) times daily as needed. For cough      . carbidopa-levodopa (SINEMET CR) 25-100 MG per tablet Take 1 tablet by mouth 4 (four) times daily. Take at 8AM, 12, 4PM and 8PM. Please deliver to Lacey Gonzales.  120 tablet  5  . carvedilol (COREG) 25 MG tablet Take 25 mg by mouth 2 (two) times daily with a meal.        . fish oil-omega-3 fatty acids 1000 MG capsule Take 1 g by mouth daily.       Marland Kitchen HYDROcodone-acetaminophen (VICODIN) 5-500 MG per tablet Take 1 tablet by mouth every 6 (six) hours as needed. For pain      . ipratropium (ATROVENT HFA) 17 MCG/ACT inhaler Inhale 2 puffs into the lungs as needed.      . iron polysaccharides (NIFEREX) 150 MG capsule Take 1 capsule (150 mg total) by mouth daily.  30 capsule  0  . levothyroxine (SYNTHROID, LEVOTHROID) 150 MCG tablet Take 150 mcg by mouth daily.      . meloxicam (MOBIC) 15 MG tablet Take 15 mg by mouth daily.        . Milnacipran (SAVELLA) 50 MG TABS Take 50 mg by mouth 2 (two) times daily.      . Multiple Vitamins-Minerals (CENTRUM PO)  Take 1 tablet by mouth daily.      . promethazine (PHENERGAN) 25 MG tablet Take 25 mg by mouth every 6 (six) hours as needed. For nausea      . QUEtiapine (SEROQUEL) 25 MG tablet Take 25 mg by mouth at bedtime.      . sertraline (ZOLOFT) 50 MG tablet Take 50 mg by mouth daily.        . [DISCONTINUED] doxepin (  SINEQUAN) 10 MG capsule Take 10 mg by mouth 2 (two) times daily.       . [DISCONTINUED] eszopiclone (LUNESTA) 2 MG TABS Take 3 mg by mouth at bedtime. Take immediately before bedtime      . [DISCONTINUED] trihexyphenidyl (ARTANE) 2 MG tablet Take 2 mg by mouth 3 (three) times daily with meals.         No facility-administered encounter medications on file as of 06/22/2013.   Review of Systems  Constitutional: Positive for activity change, appetite change and fatigue.  HENT: Positive for hearing loss and trouble swallowing.   Eyes: Positive for visual disturbance.  Respiratory: Positive for shortness of breath and wheezing.        Snoring  Cardiovascular: Positive for chest pain, palpitations and leg swelling.  Endocrine: Positive for cold intolerance, heat intolerance and polydipsia.  Genitourinary: Positive for difficulty urinating.  Musculoskeletal: Positive for myalgias, joint swelling and arthralgias.       Cramps  Skin: Positive for rash.  Neurological: Positive for dizziness, tremors, speech difficulty, weakness, numbness and headaches.       Memory loss  Hematological: Bruises/bleeds easily.  Psychiatric/Behavioral: Positive for hallucinations, confusion and dysphoric mood. The patient is nervous/anxious.        Racing thoughts    Objective:  Neurologic Exam  Physical Exam Physical Examination:   Filed Vitals:   06/22/13 1217  BP: 110/76  Pulse: 80  Temp: 97.9 F (36.6 C)   General Examination: The patient is a very pleasant 62 y.o. female in no acute distress. There seems to be significant psychomotor retardation. She appears somewhat restless at times.    HEENT: Normocephalic, atraumatic with an area of redness on the L forehead, healing. Pupils are equal, round and reactive to light and accommodation. Extraocular tracking shows moderate saccadic breakdown without nystagmus noted. There is limitation to upper gaze. There is moderate decrease in eye blink rate. Hearing is intact. Face is symmetric with moderate facial masking and normal facial sensation. There is no lip, neck or jaw tremor. Neck is severely rigid with intact passive ROM. There are no carotid bruits on auscultation. Oropharynx exam reveals moderate mouth dryness. No significant airway crowding is noted. Mallampati is class II. Tongue protrudes centrally and palate elevates symmetrically.   There is no drooling.   Chest: is clear to auscultation without wheezing, rhonchi or crackles noted.  Heart: sounds are regular and normal without murmurs, rubs or gallops noted.   Abdomen: is soft, non-tender and non-distended with normal bowel sounds appreciated on auscultation.  Extremities: There is 1-2+ edema in the distal lower extremities bilaterally with chronic stasis-like changes noted.  Skin: is warm and dry with no trophic changes noted.  Musculoskeletal: exam reveals no obvious joint deformities, tenderness or joint swelling or erythema.  Neurologically:  Mental status: The patient is awake and alert, paying fair  attention. She is able to partially provide the history. Her daughter provides details. She is oriented to: person, place, situation and month of year. Her memory, attention, language and knowledge are impaired. There is no aphasia, agnosia, apraxia or anomia. There is a moderate degree of bradyphrenia. Speech is moderately hypophonic with mild dysarthria noted. Affect is blunted.  Cranial nerves are as described above under HEENT exam. In addition, shoulder shrug is normal with equal shoulder height noted.  Motor exam: Normal bulk, and strength for age is noted. There  are no dyskinesias noted. Tone is moderately rigid with absence of cogwheeling in the bilateral upper  extremity. There is overall moderate bradykinesia. There is no drift or rebound. There is no tremor. Romberg is negative. Reflexes are 1+ in the upper extremities and 1+ in the lower extremities. Toes are downgoing bilaterally. Fine motor skills exam reveals: Finger taps are moderately impaired on the right and moderately impaired on the left. Hand movements are moderately impaired on the right and moderately impaired on the left. RAP (rapid alternating patting) is moderately impaired on the right and moderately impaired on the left. Foot taps are severely impaired on the right and severely impaired on the left. Foot agility (in the form of heel stomping) is severely impaired on the right and severely impaired on the left.    Cerebellar testing shows no dysmetria or intention tremor on finger to nose testing. There is no truncal or gait ataxia.   Sensory exam is intact to light touch.  Gait, station and balance exam: She stands up from the seated position with moderate difficulty and needs some assistance. No veering to one side is noted. She is not noted to lean to the side. Posture is mildly stooped. Stance is wide-based. She walks with decrease in stride length and pace and decreased arm swing. She turns in multiple steps. Tandem walk is not possible. Balance is moderately impaired. She uses a 4 wheeled walker.     Assessment and Plan:    In summary, Lacey Gonzales is a 62 y.o.-year old female with a prior Dx of Parkinson's disease and memory loss. She has a history of depression and anxiety. I agree with Dr. Don Perking assessment and appreciate her input. Given her Hx of memory loss, hallucination and parkinsonism, the lack of significant efficacy of L-dopa and lack of obvious lateralization, one has to worry about Lewy body dementia. They are advised of this possibility and understand, that there is no  specific medication for this condition. She is advised to stop smoking, use her walker at all times and reduce sedating medications as much as possible. She would like to continue with the Sinemet CR 4 times a day, as she feels, it may be helpful. She will see her psychiatrist again soon. I suggested that she see me back in 4 months from now, sooner if the need arises. She is advised to elevate her legs whenever possible. She is advised to drink more water. I will order a CTH wo contrast as she had a recent fall and bumped her head. We will call the daughter with the test result. I answered all her questions and encouraged them to call with any interim concerns or problems a refill requests, they were in agreement.

## 2013-06-24 ENCOUNTER — Telehealth: Payer: Self-pay | Admitting: Neurology

## 2013-06-24 NOTE — Telephone Encounter (Signed)
Called to let the patient daughter know her mother appointment is 07-05-2013 @3pm 

## 2013-07-05 ENCOUNTER — Ambulatory Visit
Admission: RE | Admit: 2013-07-05 | Discharge: 2013-07-05 | Disposition: A | Discharge status: Home / Self Care | Payer: Medicare Other | Source: Ambulatory Visit | Attending: Neurology | Admitting: Neurology

## 2013-07-05 DIAGNOSIS — Z9181 History of falling: Secondary | ICD-10-CM

## 2013-07-05 DIAGNOSIS — R413 Other amnesia: Secondary | ICD-10-CM

## 2013-07-05 DIAGNOSIS — G20 Parkinson's disease: Secondary | ICD-10-CM

## 2013-07-11 ENCOUNTER — Ambulatory Visit: Payer: Self-pay | Admitting: Internal Medicine

## 2013-07-15 NOTE — Progress Notes (Signed)
Quick Note:  Please advise patient that the recent head CT without contrast did not show any acute findings. In particular there was no mass lesion or blood products. There is atrophy or volume loss of the brain but it is unchanged from before. The scan is compared with a CT scan from April of this year and has remained unchanged. No further action is required on this test at this time. Please advise patient to keep any followup appointments and call with any interim questions, concerns, problems or updates. Huston Foley, MD, PhD [facility] ([facility] ______

## 2013-07-18 NOTE — Progress Notes (Signed)
Quick Note:  I called and relayed to [place], POA that CT results, as per below. She verbalized understanding. ______

## 2013-07-20 LAB — URINALYSIS, COMPLETE
Bilirubin,UR: NEGATIVE
Blood: NEGATIVE
Glucose,UR: NEGATIVE mg/dL (ref 0–75)
Ketone: NEGATIVE
Nitrite: NEGATIVE
Protein: NEGATIVE
RBC,UR: 1 /HPF (ref 0–5)
Squamous Epithelial: 1

## 2013-07-21 ENCOUNTER — Observation Stay: Payer: Self-pay | Admitting: Student

## 2013-07-21 DIAGNOSIS — R0602 Shortness of breath: Secondary | ICD-10-CM

## 2013-07-21 LAB — CBC WITH DIFFERENTIAL/PLATELET
Basophil %: 1.2 %
Eosinophil #: 0.2 10*3/uL (ref 0.0–0.7)
Eosinophil %: 3.4 %
Lymphocyte %: 32.3 %
MCHC: 35.1 g/dL (ref 32.0–36.0)
MCV: 86 fL (ref 80–100)
Neutrophil %: 54.7 %
Platelet: 108 10*3/uL — ABNORMAL LOW (ref 150–440)
RDW: 13.2 % (ref 11.5–14.5)
WBC: 6.2 10*3/uL (ref 3.6–11.0)

## 2013-07-21 LAB — COMPREHENSIVE METABOLIC PANEL
Albumin: 4 g/dL (ref 3.4–5.0)
Alkaline Phosphatase: 101 U/L (ref 50–136)
Anion Gap: 6 — ABNORMAL LOW (ref 7–16)
Bilirubin,Total: 0.3 mg/dL (ref 0.2–1.0)
Co2: 28 mmol/L (ref 21–32)
EGFR (African American): >60
EGFR (Non-African Amer.): >60
Glucose: 70 mg/dL (ref 65–99)
Osmolality: 278 (ref 275–301)
Potassium: 4.4 mmol/L (ref 3.5–5.1)
Sodium: 138 mmol/L (ref 136–145)
Total Protein: 7.6 g/dL (ref 6.4–8.2)

## 2013-07-21 LAB — PRO B NATRIURETIC PEPTIDE: B-Type Natriuretic Peptide: 439 pg/mL — ABNORMAL HIGH (ref 0–125)

## 2013-07-21 LAB — CK TOTAL AND CKMB (NOT AT ARMC)
CK, Total: 62 U/L (ref 21–215)
CK-MB: 1.4 ng/mL (ref 0.5–3.6)

## 2013-07-21 LAB — TROPONIN I
Troponin-I: <0.02 ng/mL
Troponin-I: <0.02 ng/mL

## 2013-07-22 LAB — LIPID PANEL
Cholesterol: 187 mg/dL (ref 0–200)
Ldl Cholesterol, Calc: 108 mg/dL — ABNORMAL HIGH (ref 0–100)
Triglycerides: 137 mg/dL (ref 0–200)

## 2013-07-24 LAB — BASIC METABOLIC PANEL
Anion Gap: 4 — ABNORMAL LOW (ref 7–16)
BUN: 27 mg/dL — ABNORMAL HIGH (ref 7–18)
Calcium, Total: 8.8 mg/dL (ref 8.5–10.1)
Chloride: 105 mmol/L (ref 98–107)
Co2: 28 mmol/L (ref 21–32)
Creatinine: 1.19 mg/dL (ref 0.60–1.30)
EGFR (African American): 57 — ABNORMAL LOW
EGFR (Non-African Amer.): 50 — ABNORMAL LOW
Potassium: 3.9 mmol/L (ref 3.5–5.1)

## 2013-07-24 LAB — PLATELET COUNT: Platelet: 137 10*3/uL — ABNORMAL LOW (ref 150–440)

## 2013-08-10 ENCOUNTER — Ambulatory Visit: Payer: Self-pay | Admitting: Internal Medicine

## 2013-10-23 ENCOUNTER — Other Ambulatory Visit: Payer: Self-pay

## 2013-10-23 DIAGNOSIS — G20 Parkinson's disease: Secondary | ICD-10-CM

## 2013-10-23 MED ORDER — CARBIDOPA-LEVODOPA ER 25-100 MG PO TBCR: 1.0000 | EXTENDED_RELEASE_TABLET | Freq: Four times a day (QID) | ORAL | Status: DC

## 2013-10-25 ENCOUNTER — Ambulatory Visit: Payer: Medicare Other | Admitting: Neurology

## 2013-10-31 ENCOUNTER — Ambulatory Visit: Payer: Medicare Other | Admitting: Neurology

## 2013-11-16 ENCOUNTER — Ambulatory Visit: Payer: Self-pay | Admitting: Family Medicine

## 2014-02-23 LAB — URINALYSIS, COMPLETE
BLOOD: NEGATIVE
Bilirubin,UR: NEGATIVE
Glucose,UR: NEGATIVE mg/dL (ref 0–75)
Hyaline Cast: 1
Ketone: NEGATIVE
Nitrite: POSITIVE
PH: 5 (ref 4.5–8.0)
Protein: NEGATIVE
RBC,UR: NONE SEEN /HPF (ref 0–5)
Specific Gravity: 1.006 (ref 1.003–1.030)

## 2014-02-23 LAB — PROTIME-INR
INR: 1
Prothrombin Time: 13.3 secs (ref 11.5–14.7)

## 2014-02-23 LAB — BASIC METABOLIC PANEL
Anion Gap: 6 — ABNORMAL LOW (ref 7–16)
BUN: 29 mg/dL — ABNORMAL HIGH (ref 7–18)
CREATININE: 1.58 mg/dL — AB (ref 0.60–1.30)
Calcium, Total: 8.8 mg/dL (ref 8.5–10.1)
Chloride: 106 mmol/L (ref 98–107)
Co2: 25 mmol/L (ref 21–32)
EGFR (African American): 41 — ABNORMAL LOW
EGFR (Non-African Amer.): 35 — ABNORMAL LOW
Glucose: 83 mg/dL (ref 65–99)
Osmolality: 279 (ref 275–301)
POTASSIUM: 4.3 mmol/L (ref 3.5–5.1)
Sodium: 137 mmol/L (ref 136–145)

## 2014-02-23 LAB — CBC WITH DIFFERENTIAL/PLATELET
BASOS ABS: 0.1 10*3/uL (ref 0.0–0.1)
BASOS PCT: 1 %
EOS PCT: 2 %
Eosinophil #: 0.2 10*3/uL (ref 0.0–0.7)
HCT: 37.1 % (ref 35.0–47.0)
HGB: 12.5 g/dL (ref 12.0–16.0)
Lymphocyte #: 1.7 10*3/uL (ref 1.0–3.6)
Lymphocyte %: 19.6 %
MCH: 32.2 pg (ref 26.0–34.0)
MCHC: 33.7 g/dL (ref 32.0–36.0)
MCV: 96 fL (ref 80–100)
MONO ABS: 0.6 x10 3/mm (ref 0.2–0.9)
Monocyte %: 6.5 %
NEUTROS ABS: 6.2 10*3/uL (ref 1.4–6.5)
Neutrophil %: 70.9 %
PLATELETS: 205 10*3/uL (ref 150–440)
RBC: 3.88 10*6/uL (ref 3.80–5.20)
RDW: 14.9 % — ABNORMAL HIGH (ref 11.5–14.5)
WBC: 8.8 10*3/uL (ref 3.6–11.0)

## 2014-02-23 LAB — TSH: Thyroid Stimulating Horm: 2.29 u[IU]/mL

## 2014-02-23 LAB — APTT: ACTIVATED PTT: 30.3 s (ref 23.6–35.9)

## 2014-02-23 LAB — TROPONIN I

## 2014-02-23 LAB — PRO B NATRIURETIC PEPTIDE: B-Type Natriuretic Peptide: 163 pg/mL — ABNORMAL HIGH (ref 0–125)

## 2014-02-24 ENCOUNTER — Inpatient Hospital Stay: Payer: Self-pay | Admitting: Internal Medicine

## 2014-02-24 LAB — CK-MB
CK-MB: 1.4 ng/mL (ref 0.5–3.6)
CK-MB: 1.3 ng/mL (ref 0.5–3.6)
CK-MB: 4.8 ng/mL — AB (ref 0.5–3.6)

## 2014-02-24 LAB — TROPONIN I
Troponin-I: <0.02 ng/mL
Troponin-I: <0.02 ng/mL
Troponin-I: <0.02 ng/mL
Troponin-I: <0.02 ng/mL
Troponin-I: <0.02 ng/mL

## 2014-02-24 LAB — CBC WITH DIFFERENTIAL/PLATELET
BASOS ABS: 0.1 10*3/uL (ref 0.0–0.1)
BASOS PCT: 1 %
EOS PCT: 2.9 %
Eosinophil #: 0.2 10*3/uL (ref 0.0–0.7)
HCT: 32.8 % — AB (ref 35.0–47.0)
HGB: 11 g/dL — ABNORMAL LOW (ref 12.0–16.0)
Lymphocyte #: 1.7 10*3/uL (ref 1.0–3.6)
Lymphocyte %: 21.9 %
MCH: 32.3 pg (ref 26.0–34.0)
MCHC: 33.6 g/dL (ref 32.0–36.0)
MCV: 96 fL (ref 80–100)
Monocyte #: 0.7 x10 3/mm (ref 0.2–0.9)
Monocyte %: 9.2 %
NEUTROS ABS: 5 10*3/uL (ref 1.4–6.5)
Neutrophil %: 65 %
Platelet: 166 10*3/uL (ref 150–440)
RBC: 3.42 10*6/uL — AB (ref 3.80–5.20)
RDW: 14.4 % (ref 11.5–14.5)
WBC: 7.7 10*3/uL (ref 3.6–11.0)

## 2014-02-24 LAB — TSH: Thyroid Stimulating Horm: 3.88 u[IU]/mL

## 2014-02-24 LAB — CK TOTAL AND CKMB (NOT AT ARMC)
CK, Total: 639 U/L — ABNORMAL HIGH
CK, Total: 622 U/L — ABNORMAL HIGH
CK, Total: 555 U/L — ABNORMAL HIGH
CK-MB: 18.7 ng/mL — AB (ref 0.5–3.6)
CK-MB: 17.1 ng/mL — ABNORMAL HIGH (ref 0.5–3.6)
CK-MB: 12.2 ng/mL — ABNORMAL HIGH (ref 0.5–3.6)

## 2014-02-24 LAB — BASIC METABOLIC PANEL
Anion Gap: 3 — ABNORMAL LOW (ref 7–16)
BUN: 31 mg/dL — ABNORMAL HIGH (ref 7–18)
CO2: 26 mmol/L (ref 21–32)
CREATININE: 1.67 mg/dL — AB (ref 0.60–1.30)
Calcium, Total: 8.3 mg/dL — ABNORMAL LOW (ref 8.5–10.1)
Chloride: 106 mmol/L (ref 98–107)
EGFR (African American): 38 — ABNORMAL LOW
GFR CALC NON AF AMER: 33 — AB
Glucose: 101 mg/dL — ABNORMAL HIGH (ref 65–99)
OSMOLALITY: 277 (ref 275–301)
Potassium: 3.6 mmol/L (ref 3.5–5.1)
Sodium: 135 mmol/L — ABNORMAL LOW (ref 136–145)

## 2014-02-24 LAB — LIPID PANEL
Cholesterol: 152 mg/dL (ref 0–200)
HDL Cholesterol: 39 mg/dL — ABNORMAL LOW (ref 40–60)
LDL CHOLESTEROL, CALC: 91 mg/dL (ref 0–100)
TRIGLYCERIDES: 110 mg/dL (ref 0–200)
VLDL Cholesterol, Calc: 22 mg/dL (ref 5–40)

## 2014-02-25 LAB — CBC WITH DIFFERENTIAL/PLATELET
Basophil #: 0 10*3/uL (ref 0.0–0.1)
Basophil %: 0.5 %
Eosinophil #: 0.1 10*3/uL (ref 0.0–0.7)
Eosinophil %: 1.3 %
HCT: 30.3 % — ABNORMAL LOW (ref 35.0–47.0)
HGB: 10 g/dL — AB (ref 12.0–16.0)
LYMPHS PCT: 13.9 %
Lymphocyte #: 1.1 10*3/uL (ref 1.0–3.6)
MCH: 32 pg (ref 26.0–34.0)
MCHC: 33.2 g/dL (ref 32.0–36.0)
MCV: 96 fL (ref 80–100)
MONO ABS: 0.6 x10 3/mm (ref 0.2–0.9)
MONOS PCT: 8 %
NEUTROS PCT: 76.3 %
Neutrophil #: 6 10*3/uL (ref 1.4–6.5)
PLATELETS: 131 10*3/uL — AB (ref 150–440)
RBC: 3.14 10*6/uL — ABNORMAL LOW (ref 3.80–5.20)
RDW: 15 % — ABNORMAL HIGH (ref 11.5–14.5)
WBC: 7.9 10*3/uL (ref 3.6–11.0)

## 2014-02-25 LAB — BASIC METABOLIC PANEL
Anion Gap: 7 (ref 7–16)
BUN: 26 mg/dL — ABNORMAL HIGH (ref 7–18)
CHLORIDE: 108 mmol/L — AB (ref 98–107)
Calcium, Total: 7.8 mg/dL — ABNORMAL LOW (ref 8.5–10.1)
Co2: 26 mmol/L (ref 21–32)
Creatinine: 1.59 mg/dL — ABNORMAL HIGH (ref 0.60–1.30)
EGFR (Non-African Amer.): 35 — ABNORMAL LOW
GFR CALC AF AMER: 40 — AB
GLUCOSE: 110 mg/dL — AB (ref 65–99)
Osmolality: 287 (ref 275–301)
Potassium: 4 mmol/L (ref 3.5–5.1)
SODIUM: 141 mmol/L (ref 136–145)

## 2014-02-25 LAB — URINE CULTURE

## 2014-02-25 LAB — TSH: THYROID STIMULATING HORM: 2.12 u[IU]/mL

## 2014-02-25 LAB — CK: CK, Total: 475 U/L — ABNORMAL HIGH

## 2014-02-25 LAB — LIPASE, BLOOD: LIPASE: 89 U/L (ref 73–393)

## 2014-02-26 LAB — BASIC METABOLIC PANEL
Anion Gap: 5 — ABNORMAL LOW (ref 7–16)
BUN: 16 mg/dL (ref 7–18)
Calcium, Total: 8.5 mg/dL (ref 8.5–10.1)
Chloride: 111 mmol/L — ABNORMAL HIGH (ref 98–107)
Co2: 25 mmol/L (ref 21–32)
Creatinine: 1.23 mg/dL (ref 0.60–1.30)
EGFR (African American): 55 — ABNORMAL LOW
EGFR (Non-African Amer.): 47 — ABNORMAL LOW
Glucose: 114 mg/dL — ABNORMAL HIGH (ref 65–99)
Osmolality: 283 (ref 275–301)
Potassium: 4.2 mmol/L (ref 3.5–5.1)
Sodium: 141 mmol/L (ref 136–145)

## 2014-02-26 LAB — CLOSTRIDIUM DIFFICILE(ARMC)

## 2014-02-26 LAB — OCCULT BLOOD X 1 CARD TO LAB, STOOL: Occult Blood, Feces: POSITIVE

## 2014-02-26 LAB — URINE CULTURE

## 2014-03-16 ENCOUNTER — Ambulatory Visit: Payer: Medicare Other | Admitting: Neurology

## 2014-04-13 IMAGING — CR DG CHEST 2V
1 series · 2 of 2 positions shown · non-contrast
Comparison: none

REASON FOR EXAM: chest pain
COMMENTS:

[Series 1: x chest ap · 0.14mm/px · 2 of 2 slices shown]
[im 1/2]
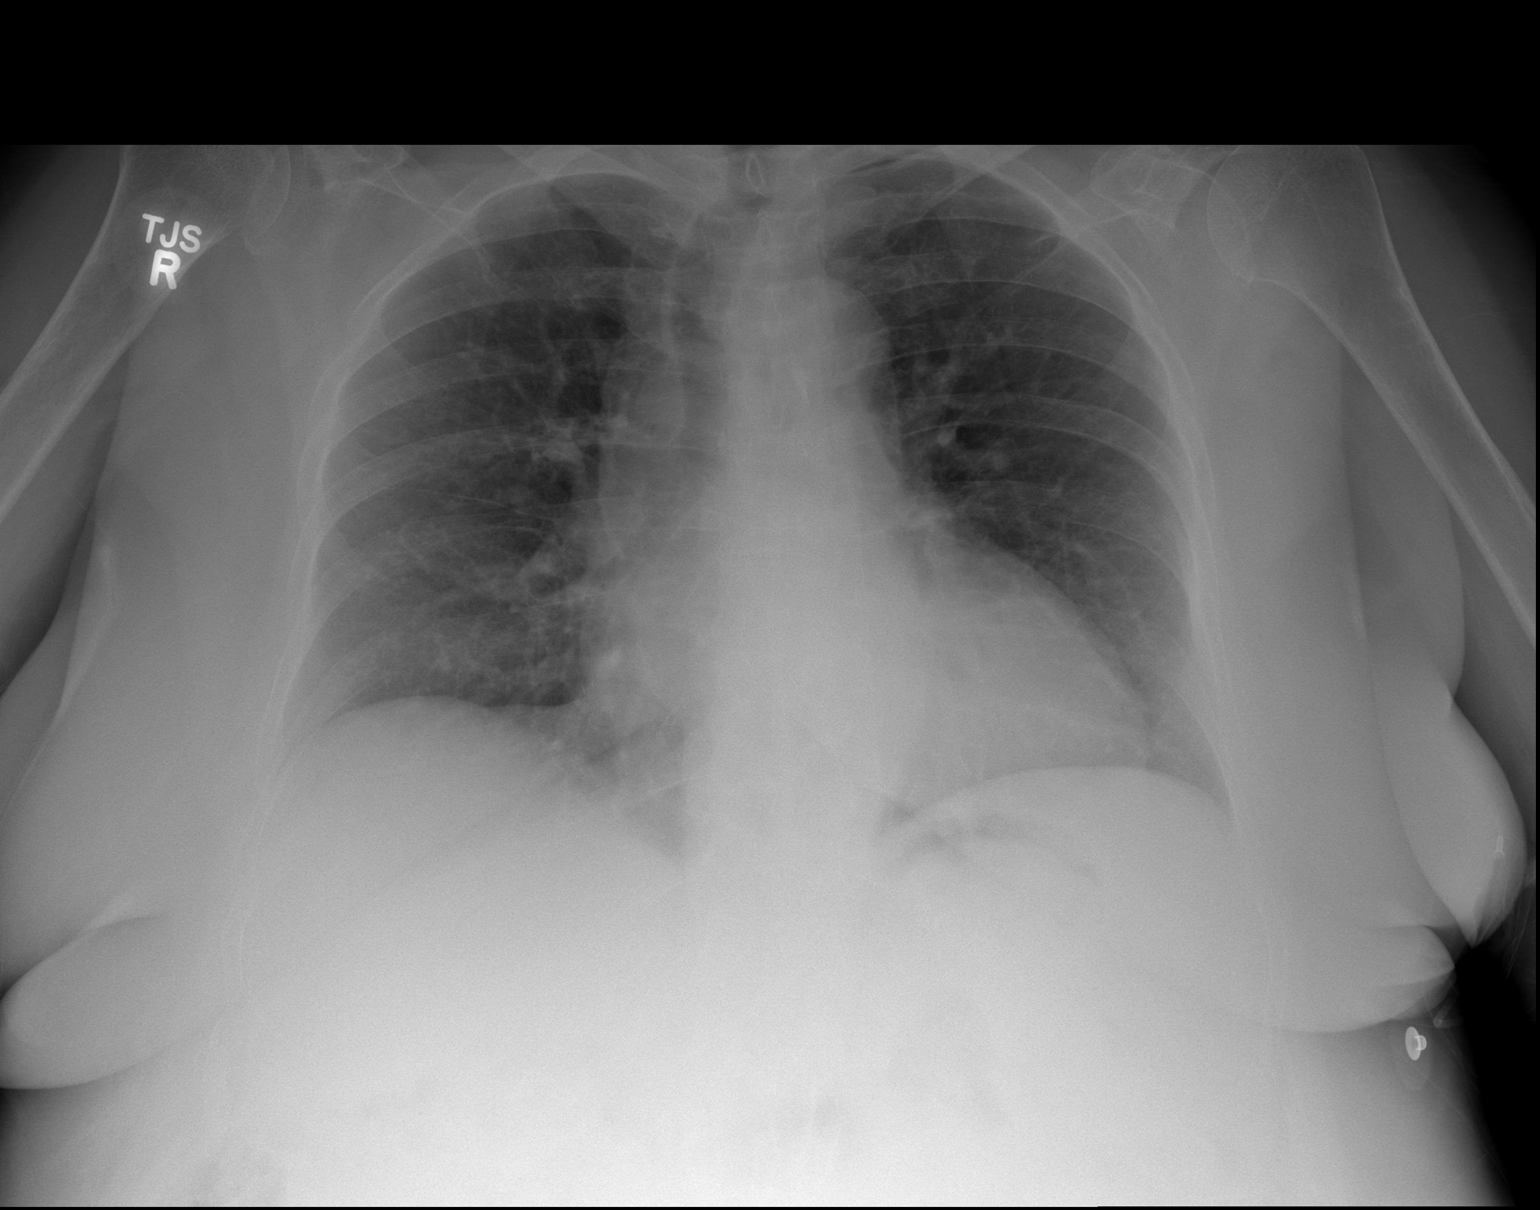
[im 2/2]
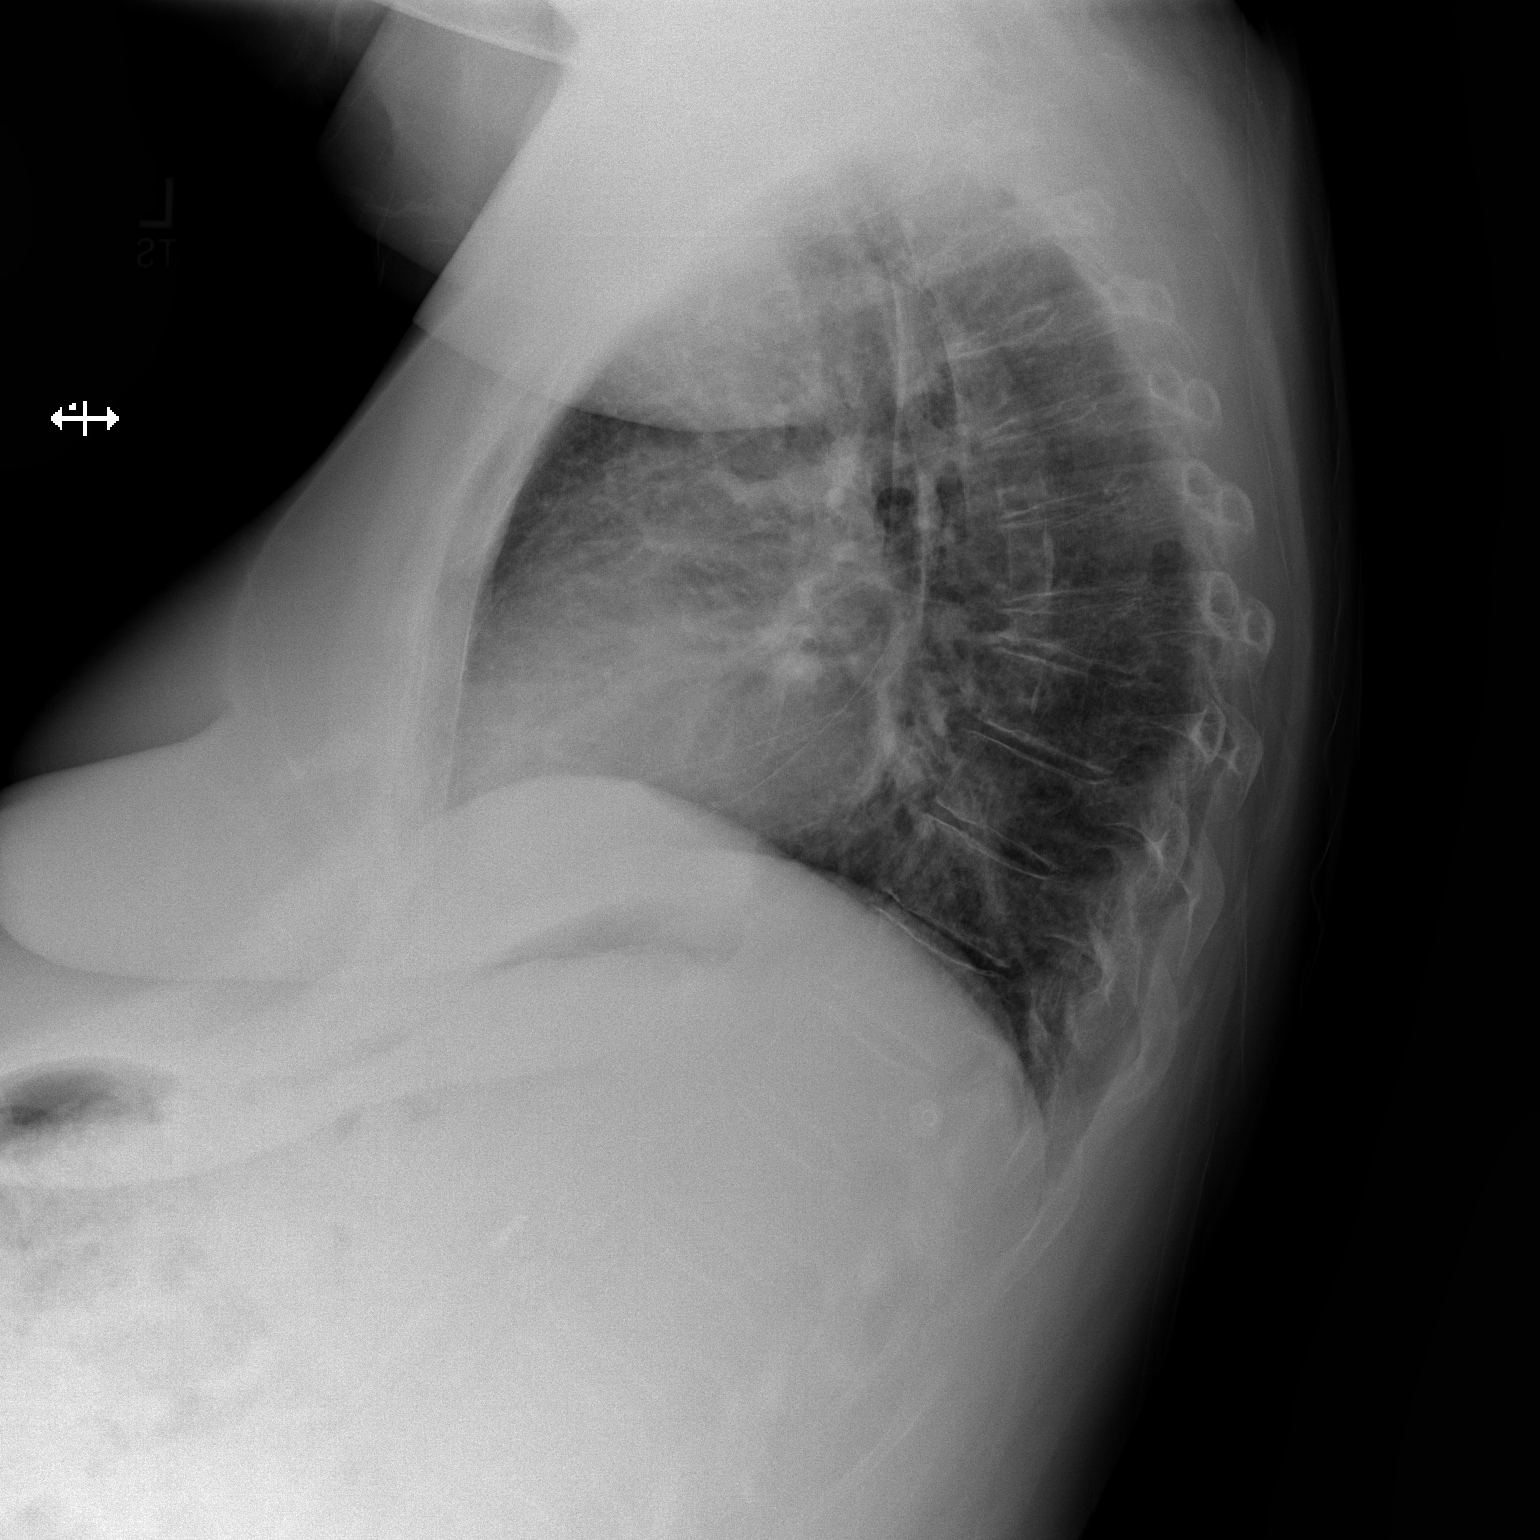

[2 of 2 positions shown; findings below may reference images not displayed]

PROCEDURE:     DXR - DXR CHEST PA (OR AP) AND LATERAL  - July 20, 2013 [DATE]

RESULT:     The lungs are borderline hypoinflated. The interstitial markings
are mildly prominent on the right. The cardiac silhouette is enlarged. The
pulmonary vascularity is not engorged. There is tortuosity of the descending
thoracic aorta. The bony thorax is normal in appearance with the exception
of a wedge compression of a midthoracic vertebral body.
IMPRESSION: There is borderline hypoinflation but no evidence of
pneumonia. I cannot exclude low-grade compensated CHF in the appropriate
clinical setting given the mild prominence of the interstitial markings.

[REDACTED]

## 2014-04-20 ENCOUNTER — Encounter: Payer: Self-pay | Admitting: Neurology

## 2014-04-20 ENCOUNTER — Encounter (INDEPENDENT_AMBULATORY_CARE_PROVIDER_SITE_OTHER): Payer: Self-pay

## 2014-04-20 ENCOUNTER — Ambulatory Visit (INDEPENDENT_AMBULATORY_CARE_PROVIDER_SITE_OTHER): Payer: Medicare HMO | Admitting: Neurology

## 2014-04-20 VITALS — BP 138/84 | HR 80 | Temp 97.5°F | Ht 60.0 in | Wt 184.0 lb

## 2014-04-20 DIAGNOSIS — F32A Depression, unspecified: Secondary | ICD-10-CM

## 2014-04-20 DIAGNOSIS — G20 Parkinson's disease: Secondary | ICD-10-CM

## 2014-04-20 DIAGNOSIS — M47812 Spondylosis without myelopathy or radiculopathy, cervical region: Secondary | ICD-10-CM

## 2014-04-20 DIAGNOSIS — F17200 Nicotine dependence, unspecified, uncomplicated: Secondary | ICD-10-CM

## 2014-04-20 DIAGNOSIS — F3289 Other specified depressive episodes: Secondary | ICD-10-CM

## 2014-04-20 DIAGNOSIS — F329 Major depressive disorder, single episode, unspecified: Secondary | ICD-10-CM

## 2014-04-20 DIAGNOSIS — R441 Visual hallucinations: Secondary | ICD-10-CM

## 2014-04-20 DIAGNOSIS — H5316 Psychophysical visual disturbances: Secondary | ICD-10-CM

## 2014-04-20 DIAGNOSIS — R413 Other amnesia: Secondary | ICD-10-CM

## 2014-04-20 NOTE — Patient Instructions (Signed)
We will continue with sinemet 4 times a day. Please try to take the medication away from you mealtimes, that is, ideally either one hour before or 2 hours after your meal to ensure optimal absorption. The medication can interfere with the protein content of your meal and trying to the protein in your food and therefore not get fully absorbed. Take it at 7 AM, 11 AM, 3 PM and 7 PM.  Common side effects reported are: Nausea, vomiting, sedation, confusion, lightheadedness. Rare side effects include hallucinations, severe nausea or vomiting, diarrhea and significant drop in blood pressure especially when going from lying to standing or from sitting to standing.  Drink more water! Stop smoking!

## 2014-04-20 NOTE — Progress Notes (Signed)
Subjective:    Patient ID: Lacey Gonzales is a 63 y.o. female.  HPI    Interim history:   Lacey Gonzales is a 63 year-old right-handed lady with a complex underlying medical history of anemia, COPD, CHF, fibromyalgia, depression, hypothyroidism, reflux disease and anxiety, who presents for follow-up consultation of her parkinsonism. She is accompanied by her sister-in-law, Lacey Gonzales, today, who moved in with the patient in 1/15 to help take care of her. Her daughter unfortunately passed away in 08/24/2023 from a massive MI. I last saw her on 06/22/2013, at which time I asked her to stop smoking. I felt that there was a possibility that she had Lewy body dementia given her history of memory loss, hallucinations, parkinsonism with lack of significant efficacy of L-dopa and lack of obvious lateralization. I kept her on Sinemet 4 times a day as she felt it was helpful. I ordered a head CT because she had reported a recent fall with bump to her head. She had head CT on 07/05/2013: 1. Moderate perisylvian atrophy. 2. No acute findings. 3. No change from CT on 02/10/13. Her healthcare power of attorney was called with the test results on 07/18/2013.  Today, she reports that she was hospitalized at [facility] in March 2015 for HTN (190s/120s at home, per Lacey Gonzales), UTI, decubitus ulcer and stayed in the hospital 4 days. She was taken off of Savella, vicodin, Tessalon, Mobic, phenergan, Seroquel. She is still on C/L qid and she still smokes, about 3 a day. She has VH much less frequently. Lacey Gonzales states that the patient fell one time in January but thankfully did not hurt herself. Overall, Lacey Gonzales feels that she is doing better since she was able to come off of some of the medications.  I first met her on 03/16/2013, at which time I increased her Sinemet. I felt that sedating medications were contributor to her memory dysfunction. I increased her Sinemet CR to 4 times daily. In the interim her daughter called back and reported  that there was no improvement on the increased dose of Sinemet. I suggested a second opinion with Dr. Carles Collet. She saw Dr. Carles Collet in June 2014, and Dr. Carles Collet felt that there may be concern for LBD, in the face of lack of efficacy with levodopa, history of memory loss and history of hallucinations which preceded her parkinsonism. She advised the patient to stop Artane. Her Psychiatrist, Dr. Kasandra Knudsen started her on Seroquel 25 mg one in the afternoon and 2 at night. She has no prior Hx of taking antipsychotics. Her daughter reported improved hallucinations since the artane was stopped. Her hallucinations started around 2008, along with memory problems and her parkinsonism started in 2010.  She used to see Dr. Erling Cruz and was last seen by him on 12/09/12 at which time he advised her to stop smoking and increased her C/L to 1 pill tid. He increased her Savella to 50 mg bid. He considered Aricept for memory loss. Her falls assessment tool score was 23 at the time.  She has a family history of tremor, and was diagnosed with PD in 2/11. MRI brain on 01/03/10 showed cortical atrophy. She was started on artane in 3/11. She does not drive. Her sister had PD as well. In 2/13 her MMSE was 20, CDT was 2/4 and AFT was 12. She was started on C/L in 2/13. She uses a walker for ambulation. In April 2013 her MMSE was 25. She needs help with her ADLs. Her husband passed away in  January last year. Since then she had a power of attorney. In January this year her MMSE was 20, clock drawing was 1, animal fluency was 8.  She needs help with her ADLs.     Her Past Medical History Is Significant For: Past Medical History  Diagnosis Date  . CHF (congestive heart failure)   . Fibromyalgia   . Depression   . Parkinson's disease   . Anemia   . COPD (chronic obstructive pulmonary disease)   . Iron deficiency anemia 01/21/2012  . Mild Thrombocytopenia 01/21/2012  . Bipolar disorder 01/21/2012  . Hypothyroidism 01/21/2012    S/P I 131 therapy  .  Fibromyalgia 01/21/2012  . Enlarged heart   . GERD (gastroesophageal reflux disease)   . Anxiety     Her Past Surgical History Is Significant For: Past Surgical History  Procedure Laterality Date  . Abdominal hysterectomy    . Cholecystectomy    . Exploratory laparotomy    . Colonoscopy  06/11/2011    Procedure: COLONOSCOPY;  Surgeon: Rogene Houston, MD;  Location: [facility];  Service: Endoscopy;  Laterality: N/A;  . Cardiac catheterization  12/30/2008    showed normal coronaries and monitor which showed some sinus tachycardia and infrequent PACs.    Her Family History Is Significant For: Family History  Problem Relation Age of Onset  . Cancer Father   . Depression Father   . Coronary artery disease Father   . Aneurysm Father   . Hypertension Mother   . Cancer Mother   . Heart attack Brother     Her Social History Is Significant For: History   Social History  . Marital Status: Widowed    Spouse Name: N/A    Number of Children: 2  . Years of Education: college   Occupational History  .      Disabled   Social History Main Topics  . Smoking status: Current Every Day Smoker -- 0.50 packs/day for 40 years    Types: Cigarettes    Last Attempt to Quit: 11/11/2011  . Smokeless tobacco: Never Used  . Alcohol Use: No  . Drug Use: No  . Sexual Activity: None   Other Topics Concern  . None   Social History Narrative   Pt lives at home with daughter.   Caffeine Use: 2-3 cups weekly    Her Allergies Are:  Allergies  Allergen Reactions  . Sulfur   :   Her Current Medications Are:  Outpatient Encounter Prescriptions as of 04/20/2014  Medication Sig  . ALPRAZolam (XANAX) 0.5 MG tablet Take 0.5 mg by mouth 4 (four) times daily as needed.   . Carbidopa-Levodopa ER (SINEMET CR) 25-100 MG tablet controlled release Take 1 tablet by mouth 4 (four) times daily.  . carvedilol (COREG) 25 MG tablet Take 25 mg by mouth 2 (two) times daily with a meal.    . fish  oil-omega-3 fatty acids 1000 MG capsule Take 1 g by mouth daily.   . iron polysaccharides (NIFEREX) 150 MG capsule Take 1 capsule (150 mg total) by mouth daily.  Marland Kitchen levothyroxine (SYNTHROID, LEVOTHROID) 150 MCG tablet Take 150 mcg by mouth daily.  . Multiple Vitamins-Minerals (CENTRUM PO) Take 1 tablet by mouth daily.  . sertraline (ZOLOFT) 50 MG tablet Take 50 mg by mouth daily.    Marland Kitchen ipratropium (ATROVENT HFA) 17 MCG/ACT inhaler Inhale 2 puffs into the lungs as needed.  . [DISCONTINUED] benzonatate (TESSALON) 100 MG capsule Take 100 mg by mouth 3 (three)  times daily as needed. For cough  . [DISCONTINUED] HYDROcodone-acetaminophen (VICODIN) 5-500 MG per tablet Take 1 tablet by mouth every 6 (six) hours as needed. For pain  . [DISCONTINUED] meloxicam (MOBIC) 15 MG tablet Take 15 mg by mouth daily.    . [DISCONTINUED] Milnacipran (SAVELLA) 50 MG TABS Take 50 mg by mouth 2 (two) times daily.  . [DISCONTINUED] promethazine (PHENERGAN) 25 MG tablet Take 25 mg by mouth every 6 (six) hours as needed. For nausea  . [DISCONTINUED] QUEtiapine (SEROQUEL) 25 MG tablet Take 25 mg by mouth at bedtime.  :  Review of Systems:  Out of a complete 14 point review of systems, all are reviewed and negative with the exception of these symptoms as listed below:   Review of Systems  Constitutional: Positive for unexpected weight change.  HENT: Negative.   Eyes: Positive for itching and visual disturbance (diplopia).  Respiratory: Positive for shortness of breath.   Cardiovascular: Negative.   Gastrointestinal: Negative.   Endocrine: Positive for heat intolerance.  Genitourinary: Positive for frequency.  Musculoskeletal: Positive for arthralgias, gait problem, myalgias and neck stiffness.  Skin: Negative.   Allergic/Immunologic: Negative.   Neurological: Positive for dizziness, tremors and weakness.       Memory loss  Hematological: Negative.   Psychiatric/Behavioral: Positive for confusion and dysphoric mood.  The patient is nervous/anxious.     Objective:  Neurologic Exam  Physical Exam Physical Examination:   Filed Vitals:   04/20/14 1457  BP: 138/84  Pulse: 80  Temp: 97.5 F (36.4 C)   General Examination: The patient is a very pleasant 63 y.o. female in no acute distress. There seems to be significant psychomotor retardation. She appears somewhat restless at times.   HEENT: Normocephalic, atraumatic with an area of redness on the L forehead, healing. Pupils are equal, round and reactive to light and accommodation. Extraocular tracking shows moderate saccadic breakdown without nystagmus noted. There is limitation to upper gaze. There is moderate decrease in eye blink rate. Hearing is intact. Face is symmetric with moderate facial masking and normal facial sensation. There is no lip, neck or jaw tremor. Neck is severely rigid with intact passive ROM. There are no carotid bruits on auscultation. Oropharynx exam reveals moderate mouth dryness. No significant airway crowding is noted. Mallampati is class II. Tongue protrudes centrally and palate elevates symmetrically.   There is no drooling.   Chest: is clear to auscultation without wheezing, rhonchi or crackles noted.  Heart: sounds are regular and normal without murmurs, rubs or gallops noted.   Abdomen: is soft, non-tender and non-distended with normal bowel sounds appreciated on auscultation.  Extremities: There is 1-2+ edema in the distal lower extremities bilaterally with chronic stasis-like changes noted.  Skin: is warm and dry with no trophic changes noted.  Musculoskeletal: exam reveals no obvious joint deformities, tenderness or joint swelling or erythema.  Neurologically:  Mental status: The patient is awake and alert, paying fair  attention. She is able to partially provide the history. Her daughter provides details. She is oriented to: person, place, situation and month of year. Her memory, attention, language and knowledge  are impaired. There is no aphasia, agnosia, apraxia or anomia. There is a moderate degree of bradyphrenia. Speech is moderately hypophonic with mild dysarthria noted. Affect is blunted.  Cranial nerves are as described above under HEENT exam. In addition, shoulder shrug is normal with equal shoulder height noted.  Motor exam: Normal bulk, and strength for age is noted. There are no dyskinesias  noted. Tone is moderately rigid with absence of cogwheeling in the bilateral upper extremity. There is overall moderate bradykinesia. There is no drift or rebound. There is an intermittent mild to moderate RUE resting tremor. Romberg is negative. Reflexes are 1+ in the upper extremities and 1+ in the lower extremities. Fine motor skills exam reveals: Finger taps are moderately to severely impaired b/l, hand movements are moderately impaired b/l, RAP (rapid alternating patting) is moderately impaired b/l. Foot taps are severely impaired on the right and severely impaired on the left. Foot agility (in the form of heel stomping) is severely impaired on the right and severely impaired on the left.    Cerebellar testing shows no dysmetria or intention tremor on finger to nose testing. There is no truncal or gait ataxia.   Sensory exam is intact to light touch.  Gait, station and balance exam: She stands up from the seated position with moderate difficulty and takes 3 at times but does not need any assistance. No veering to one side is noted. She is not noted to lean to the side. Posture is mild to moderately stooped. Stance is wide-based. She walks with decrease in stride length and pace and decreased arm swing. She turns in multiple steps. Tandem walk is not possible. Balance is moderately impaired. She did not bring her walker today.    Assessment and Plan:    In summary, SHERETHA SHADD is a 63 year old female with a complex underlying medical history of anemia, COPD, CHF, fibromyalgia, depression, hypothyroidism,  reflux disease and anxiety, who presents for follow-up consultation of her parkinsonism.with a prior Dx of Parkinson's disease and memory loss, smoking, depression and anxiety, chronic pain and neck pain. On examination she has remained fairly stable but does exhibit of right upper extremity resting tremor today. Her history and physical exam could be in keeping with Lewy body dementia as opposed to right-sided Parkinson's disease with memory loss. She does have no significant other lateralization. She is again advised to stop smoking, and drink more water. She is advised to use her walker to reduce the fall risk. I suggested she continue with sinemet CR 4 times a day, but advise her to take it away from her mealtimes. To that end, I have asked her to take at 7, 101, 3 PM and 7 PM. I answered all their questions and encouraged them to call with any interim concerns or problems a refill requests, they were in agreement.

## 2014-05-25 ENCOUNTER — Other Ambulatory Visit: Payer: Self-pay

## 2014-05-25 MED ORDER — CARBIDOPA-LEVODOPA ER 25-100 MG PO TBCR: 1.0000 | EXTENDED_RELEASE_TABLET | Freq: Four times a day (QID) | ORAL | Status: DC

## 2014-06-27 ENCOUNTER — Encounter: Payer: Self-pay | Admitting: General Surgery

## 2014-07-04 ENCOUNTER — Emergency Department: Payer: Self-pay | Admitting: Emergency Medicine

## 2014-07-05 ENCOUNTER — Observation Stay: Payer: Self-pay | Admitting: Internal Medicine

## 2014-07-05 LAB — CBC WITH DIFFERENTIAL/PLATELET
Basophil #: 0 10*3/uL (ref 0.0–0.1)
Basophil %: 0.3 %
EOS ABS: 0 10*3/uL (ref 0.0–0.7)
Eosinophil %: 0.1 %
HCT: 37.2 % (ref 35.0–47.0)
HGB: 12.6 g/dL (ref 12.0–16.0)
LYMPHS ABS: 0.6 10*3/uL — AB (ref 1.0–3.6)
Lymphocyte %: 5.8 %
MCH: 29.9 pg (ref 26.0–34.0)
MCHC: 34 g/dL (ref 32.0–36.0)
MCV: 88 fL (ref 80–100)
Monocyte #: 0.2 x10 3/mm (ref 0.2–0.9)
Monocyte %: 2.1 %
NEUTROS PCT: 91.7 %
Neutrophil #: 9.7 10*3/uL — ABNORMAL HIGH (ref 1.4–6.5)
PLATELETS: 175 10*3/uL (ref 150–440)
RBC: 4.22 10*6/uL (ref 3.80–5.20)
RDW: 15 % — AB (ref 11.5–14.5)
WBC: 10.6 10*3/uL (ref 3.6–11.0)

## 2014-07-05 LAB — COMPREHENSIVE METABOLIC PANEL
ALK PHOS: 110 U/L
Albumin: 3.8 g/dL (ref 3.4–5.0)
Anion Gap: 10 (ref 7–16)
BILIRUBIN TOTAL: 0.5 mg/dL (ref 0.2–1.0)
BUN: 20 mg/dL — ABNORMAL HIGH (ref 7–18)
CALCIUM: 8.9 mg/dL (ref 8.5–10.1)
Chloride: 103 mmol/L (ref 98–107)
Co2: 27 mmol/L (ref 21–32)
Creatinine: 1.24 mg/dL (ref 0.60–1.30)
EGFR (Non-African Amer.): 47 — ABNORMAL LOW
GFR CALC AF AMER: 54 — AB
GLUCOSE: 137 mg/dL — AB (ref 65–99)
Osmolality: 284 (ref 275–301)
POTASSIUM: 3.8 mmol/L (ref 3.5–5.1)
SGOT(AST): 22 U/L (ref 15–37)
SGPT (ALT): 9 U/L — ABNORMAL LOW
Sodium: 140 mmol/L (ref 136–145)
TOTAL PROTEIN: 7.8 g/dL (ref 6.4–8.2)

## 2014-07-05 LAB — URINALYSIS, COMPLETE
Bilirubin,UR: NEGATIVE
GLUCOSE, UR: NEGATIVE mg/dL (ref 0–75)
Hyaline Cast: 5
Ketone: NEGATIVE
Nitrite: NEGATIVE
PH: 7 (ref 4.5–8.0)
Protein: NEGATIVE
RBC,UR: 4 /HPF (ref 0–5)
SPECIFIC GRAVITY: 1.006 (ref 1.003–1.030)
Squamous Epithelial: 16
WBC UR: 21 /HPF (ref 0–5)

## 2014-07-05 LAB — TROPONIN I

## 2014-07-05 LAB — PRO B NATRIURETIC PEPTIDE: B-Type Natriuretic Peptide: 662 pg/mL — ABNORMAL HIGH (ref 0–125)

## 2014-07-05 LAB — TSH: Thyroid Stimulating Horm: 0.21 u[IU]/mL — ABNORMAL LOW

## 2014-07-07 ENCOUNTER — Telehealth: Payer: Self-pay | Admitting: Neurology

## 2014-07-07 LAB — URINE CULTURE

## 2014-07-07 NOTE — Telephone Encounter (Signed)
Rollen Sox (sister-in-law) calling stating the patient was just released from the hospital and instructed to be seen by her neurologist sooner than her scheduled December appointment for possible medication adjustment. .  Best number is [phone] and it is okay to leave a message.

## 2014-07-10 NOTE — Telephone Encounter (Signed)
Patient's sister in law Corrie Dandy calling again to state that patient really needs a sooner appointment than the one scheduled in December, please return call at [phone] and advise.

## 2014-07-11 ENCOUNTER — Encounter: Payer: Self-pay | Admitting: General Surgery

## 2014-07-11 NOTE — Telephone Encounter (Signed)
See centricity POA notes,printed and scanned into epic

## 2014-07-12 NOTE — Telephone Encounter (Signed)
Patient has been scheduled/confirmed 

## 2014-07-13 ENCOUNTER — Emergency Department (HOSPITAL_COMMUNITY): Payer: Medicare HMO

## 2014-07-13 ENCOUNTER — Telehealth: Payer: Self-pay | Admitting: Neurology

## 2014-07-13 ENCOUNTER — Ambulatory Visit (INDEPENDENT_AMBULATORY_CARE_PROVIDER_SITE_OTHER): Payer: Medicare HMO | Admitting: Neurology

## 2014-07-13 ENCOUNTER — Encounter: Payer: Self-pay | Admitting: Neurology

## 2014-07-13 ENCOUNTER — Observation Stay (HOSPITAL_COMMUNITY)
Admission: EM | Admit: 2014-07-13 | Discharge: 2014-07-13 | Disposition: A | Discharge status: Home / Self Care | Payer: Medicare HMO | Attending: Emergency Medicine | Admitting: Emergency Medicine

## 2014-07-13 ENCOUNTER — Encounter (HOSPITAL_COMMUNITY): Payer: Self-pay | Admitting: Emergency Medicine

## 2014-07-13 VITALS — BP 150/73 | HR 81 | Temp 97.6°F

## 2014-07-13 DIAGNOSIS — I517 Cardiomegaly: Secondary | ICD-10-CM | POA: Diagnosis not present

## 2014-07-13 DIAGNOSIS — F3289 Other specified depressive episodes: Secondary | ICD-10-CM | POA: Diagnosis present

## 2014-07-13 DIAGNOSIS — M797 Fibromyalgia: Secondary | ICD-10-CM | POA: Diagnosis present

## 2014-07-13 DIAGNOSIS — J4489 Other specified chronic obstructive pulmonary disease: Secondary | ICD-10-CM | POA: Insufficient documentation

## 2014-07-13 DIAGNOSIS — F17200 Nicotine dependence, unspecified, uncomplicated: Secondary | ICD-10-CM | POA: Diagnosis not present

## 2014-07-13 DIAGNOSIS — F411 Generalized anxiety disorder: Secondary | ICD-10-CM | POA: Diagnosis not present

## 2014-07-13 DIAGNOSIS — F319 Bipolar disorder, unspecified: Secondary | ICD-10-CM | POA: Diagnosis present

## 2014-07-13 DIAGNOSIS — R079 Chest pain, unspecified: Principal | ICD-10-CM | POA: Diagnosis present

## 2014-07-13 DIAGNOSIS — K219 Gastro-esophageal reflux disease without esophagitis: Secondary | ICD-10-CM | POA: Diagnosis not present

## 2014-07-13 DIAGNOSIS — I5032 Chronic diastolic (congestive) heart failure: Secondary | ICD-10-CM | POA: Diagnosis not present

## 2014-07-13 DIAGNOSIS — F329 Major depressive disorder, single episode, unspecified: Secondary | ICD-10-CM

## 2014-07-13 DIAGNOSIS — I509 Heart failure, unspecified: Secondary | ICD-10-CM | POA: Diagnosis not present

## 2014-07-13 DIAGNOSIS — R0789 Other chest pain: Secondary | ICD-10-CM

## 2014-07-13 DIAGNOSIS — G20 Parkinson's disease: Secondary | ICD-10-CM | POA: Insufficient documentation

## 2014-07-13 DIAGNOSIS — Z9089 Acquired absence of other organs: Secondary | ICD-10-CM | POA: Insufficient documentation

## 2014-07-13 DIAGNOSIS — Z79899 Other long term (current) drug therapy: Secondary | ICD-10-CM | POA: Diagnosis not present

## 2014-07-13 DIAGNOSIS — E039 Hypothyroidism, unspecified: Secondary | ICD-10-CM | POA: Insufficient documentation

## 2014-07-13 DIAGNOSIS — Z90710 Acquired absence of both cervix and uterus: Secondary | ICD-10-CM | POA: Diagnosis not present

## 2014-07-13 DIAGNOSIS — M25561 Pain in right knee: Secondary | ICD-10-CM | POA: Diagnosis present

## 2014-07-13 DIAGNOSIS — Z87898 Personal history of other specified conditions: Secondary | ICD-10-CM

## 2014-07-13 DIAGNOSIS — J449 Chronic obstructive pulmonary disease, unspecified: Secondary | ICD-10-CM | POA: Insufficient documentation

## 2014-07-13 DIAGNOSIS — R0782 Intercostal pain: Secondary | ICD-10-CM

## 2014-07-13 DIAGNOSIS — F3130 Bipolar disorder, current episode depressed, mild or moderate severity, unspecified: Secondary | ICD-10-CM

## 2014-07-13 DIAGNOSIS — IMO0002 Reserved for concepts with insufficient information to code with codable children: Secondary | ICD-10-CM | POA: Diagnosis not present

## 2014-07-13 DIAGNOSIS — M25562 Pain in left knee: Secondary | ICD-10-CM | POA: Diagnosis present

## 2014-07-13 DIAGNOSIS — Z9289 Personal history of other medical treatment: Secondary | ICD-10-CM

## 2014-07-13 DIAGNOSIS — IMO0001 Reserved for inherently not codable concepts without codable children: Secondary | ICD-10-CM | POA: Diagnosis not present

## 2014-07-13 DIAGNOSIS — D509 Iron deficiency anemia, unspecified: Secondary | ICD-10-CM | POA: Diagnosis not present

## 2014-07-13 DIAGNOSIS — R413 Other amnesia: Secondary | ICD-10-CM

## 2014-07-13 DIAGNOSIS — G20A1 Parkinson's disease without dyskinesia, without mention of fluctuations: Secondary | ICD-10-CM | POA: Insufficient documentation

## 2014-07-13 DIAGNOSIS — F32A Depression, unspecified: Secondary | ICD-10-CM

## 2014-07-13 LAB — CBC WITH DIFFERENTIAL/PLATELET
BASOS PCT: 0 % (ref 0–1)
Basophils Absolute: 0 10*3/uL (ref 0.0–0.1)
EOS PCT: 1 % (ref 0–5)
Eosinophils Absolute: 0.1 10*3/uL (ref 0.0–0.7)
HCT: 35.5 % — ABNORMAL LOW (ref 36.0–46.0)
HEMOGLOBIN: 12.3 g/dL (ref 12.0–15.0)
LYMPHS PCT: 20 % (ref 12–46)
Lymphs Abs: 1.6 10*3/uL (ref 0.7–4.0)
MCH: 30 pg (ref 26.0–34.0)
MCHC: 34.6 g/dL (ref 30.0–36.0)
MCV: 86.6 fL (ref 78.0–100.0)
MONO ABS: 0.7 10*3/uL (ref 0.1–1.0)
MONOS PCT: 8 % (ref 3–12)
NEUTROS ABS: 5.7 10*3/uL (ref 1.7–7.7)
Neutrophils Relative %: 71 % (ref 43–77)
Platelets: 152 10*3/uL (ref 150–400)
RBC: 4.1 MIL/uL (ref 3.87–5.11)
RDW: 14.3 % (ref 11.5–15.5)
WBC: 8 10*3/uL (ref 4.0–10.5)

## 2014-07-13 LAB — I-STAT CHEM 8, ED
BUN: 31 mg/dL — ABNORMAL HIGH (ref 6–23)
CALCIUM ION: 1.08 mmol/L — AB (ref 1.13–1.30)
Chloride: 103 mEq/L (ref 96–112)
Creatinine, Ser: 1.2 mg/dL — ABNORMAL HIGH (ref 0.50–1.10)
GLUCOSE: 97 mg/dL (ref 70–99)
HCT: 36 % (ref 36.0–46.0)
Hemoglobin: 12.2 g/dL (ref 12.0–15.0)
Potassium: 4 mEq/L (ref 3.7–5.3)
Sodium: 137 mEq/L (ref 137–147)
TCO2: 32 mmol/L (ref 0–100)

## 2014-07-13 LAB — I-STAT TROPONIN, ED
Troponin i, poc: 0 ng/mL (ref 0.00–0.08)
Troponin i, poc: 0 ng/mL (ref 0.00–0.08)

## 2014-07-13 LAB — PRO B NATRIURETIC PEPTIDE: Pro B Natriuretic peptide (BNP): 181.4 pg/mL — ABNORMAL HIGH (ref 0–125)

## 2014-07-13 MED ORDER — TRAMADOL HCL 50 MG PO TABS
50.0000 mg | ORAL_TABLET | Freq: Four times a day (QID) | ORAL | Status: DC | PRN
Start: 2014-07-13 — End: 2014-07-30

## 2014-07-13 MED ORDER — TRAMADOL HCL 50 MG PO TABS
50.0000 mg | ORAL_TABLET | Freq: Once | ORAL | Status: AC
  Administered 2014-07-13: 50 mg via ORAL
  Filled 2014-07-13: qty 1

## 2014-07-13 NOTE — Progress Notes (Signed)
Subjective:    Patient ID: Lacey Gonzales is a 63 y.o. female.  HPI    Interim history:   Lacey Gonzales is a 63 year-old right-handed lady with a complex underlying medical history of anemia, COPD, CHF, fibromyalgia, depression, hypothyroidism, reflux disease and anxiety, who presents for follow-up consultation of Lacey parkinsonism, presenting for Lacey symptoms and scheduled appointment because of Lacey recent hospitalization. She is accompanied by Lacey sister-in-law, Lacey Gonzales, again today. I last saw Lacey on 04/20/2014, at which time I felt she had remained fairly stable. I felt that there was a possibility that she has Lewy body dementia as opposed Parkinson's disease with dementia. She was advised to stop smoking and increase Lacey water intake. She was advised to use Lacey walker at all times to reduce Lacey fall risk. I continued Lacey on Sinemet long-acting 4 times a day. She had been hospitalized at [facility] in March 2015 for HTN (190s/120s at home, per [name]), UTI, decubitus ulcer and stayed in the hospital 4 days. She was taken off of Savella, vicodin, Tessalon, Mobic, phenergan, and Seroquel and was kept on C/L qid. Lacey VH were less frequent. She reported one fall in January but did not hurt herself. Lacey sister-in-law felt that she was doing better since she came off of some of Lacey medications.  Today, the patient started complaining of substernal and midline chest pain while she was waiting to be seen. She was in our treatment room and I examined Lacey. Of note, Lacey Gonzales tells me that the patient was noncompliant with Lacey Lasix and had recently been admitted to [name] for congestive heart failure exacerbation. She had also not taken Lacey Lasix this morning. She did say that Lacey pain was radiating to the right arm but this is the side that is worse with a parkinsonism. Lacey Gonzales noted that the patient was fine this morning and since the hospital stay had been doing well. She did endorse some shortness of  breath while sitting and started crying as well. She has an appointment with Lacey psychiatrist soon.  Lacey Gonzales moved in with the patient in 1/15 to help take care of Lacey. Patient's Gonzales passed away in 08-15-2023 from a massive MI. I saw Lacey on 06/22/2013, at which time I asked Lacey to stop smoking. I felt that there was a possibility that she had Lewy body dementia given Lacey history of memory loss, hallucinations, parkinsonism with lack of significant efficacy of L-dopa and lack of obvious lateralization. I kept Lacey on Sinemet 4 times a day as she felt it was helpful. I ordered a head CT because she had reported a recent fall with bump to Lacey head. She had head CT on 07/05/2013: 1. Moderate perisylvian atrophy. 2. No acute findings. 3. No change from CT on 02/10/13. Lacey healthcare power of attorney was called with the test results on 07/18/2013.  I first met Lacey on 03/16/2013, at which time I increased Lacey Sinemet. I felt that sedating medications were contributor to Lacey memory dysfunction. I increased Lacey Sinemet CR to 4 times daily. In the interim Lacey Gonzales called back and reported that there was no improvement on the increased dose of Sinemet. I suggested a second opinion with Dr. Carles Collet. She saw Dr. Carles Collet in June 2014, and Dr. Carles Collet felt that there may be concern for LBD, in the face of lack of efficacy with levodopa, history of memory loss and history of hallucinations which preceded Lacey parkinsonism. She advised the patient to stop Artane.  Lacey Psychiatrist, Dr. Kasandra Knudsen started Lacey on Seroquel 25 mg one in the afternoon and 2 at night. She has no prior Hx of taking antipsychotics. Lacey Gonzales reported improved hallucinations since the artane was stopped. Lacey hallucinations started around 2008, along with memory problems and Lacey parkinsonism started in 2010.  She used to see Dr. Erling Cruz and was last seen by him on 12/09/12 at which time he advised Lacey to stop smoking and increased Lacey C/L to 1 pill tid. He increased Lacey Savella  to 50 mg bid. He considered Aricept for memory loss. Lacey falls assessment tool score was 23 at the time.  She has a family history of tremor, and was diagnosed with PD in 2/11. MRI brain on 01/03/10 showed cortical atrophy. She was started on artane in 3/11. She does not drive. Lacey sister had PD as well. In 2/13 Lacey MMSE was 20, CDT was 2/4 and AFT was 12. She was started on C/L in 2/13. She uses a walker for ambulation. In April 2013 Lacey MMSE was 25. She needs help with Lacey ADLs. Lacey husband passed away in 12-21-2022 last year. Since then she had a power of attorney. In Dec 21, 2022 this year Lacey MMSE was 20, clock drawing was 1, animal fluency was 8.  She needs help with Lacey ADLs.    Lacey Past Medical History Is Significant For: Past Medical History  Diagnosis Date  . CHF (congestive heart failure)   . Fibromyalgia   . Depression   . Parkinson's disease   . Anemia   . COPD (chronic obstructive pulmonary disease)   . Iron deficiency anemia 01/21/2012  . Mild Thrombocytopenia 01/21/2012  . Bipolar disorder 01/21/2012  . Hypothyroidism 01/21/2012    S/P I 131 therapy  . Fibromyalgia 01/21/2012  . Enlarged heart   . GERD (gastroesophageal reflux disease)   . Anxiety     Lacey Past Surgical History Is Significant For: Past Surgical History  Procedure Laterality Date  . Abdominal hysterectomy    . Cholecystectomy    . Exploratory laparotomy    . Colonoscopy  06/11/2011    Procedure: COLONOSCOPY;  Surgeon: Rogene Houston, MD;  [facility];  Service: Endoscopy;  Laterality: N/A;  . Cardiac catheterization  12/30/2008    showed normal coronaries and monitor which showed some sinus tachycardia and infrequent PACs.    Lacey Family History Is Significant For: Family History  Problem Relation Age of Onset  . Cancer Father   . Depression Father   . Coronary artery disease Father   . Aneurysm Father   . Hypertension Mother   . Cancer Mother   . Heart attack Brother     Lacey Social History  Is Significant For: History   Social History  . Marital Status: Widowed    Spouse Name: N/A    Number of Children: 2  . Years of Education: college   Occupational History  .      Disabled   Social History Main Topics  . Smoking status: Current Every Day Smoker -- 0.50 packs/day for 40 years    Types: Cigarettes    Last Attempt to Quit: 11/11/2011  . Smokeless tobacco: Never Used  . Alcohol Use: No  . Drug Use: No  . Sexual Activity: None   Other Topics Concern  . None   Social History Narrative   Pt lives at home with Gonzales.   Caffeine Use: 2-3 cups weekly    Lacey Allergies Are:  Allergies  Allergen Reactions  . Sulfur   :   Lacey Current Medications Are:  Outpatient Encounter Prescriptions as of 07/13/2014  Medication Sig  . ALPRAZolam (XANAX) 0.5 MG tablet Take 0.5 mg by mouth 4 (four) times daily as needed.   . Carbidopa-Levodopa ER (SINEMET CR) 25-100 MG tablet controlled release Take 1 tablet by mouth 4 (four) times daily. at 7 AM, 11 AM, 3 PM and 7 PM  . carvedilol (COREG) 25 MG tablet Take 25 mg by mouth 2 (two) times daily with a meal.    . fish oil-omega-3 fatty acids 1000 MG capsule Take 1 g by mouth daily.   Marland Kitchen ipratropium (ATROVENT HFA) 17 MCG/ACT inhaler Inhale 2 puffs into the lungs as needed.  Marland Kitchen levothyroxine (SYNTHROID, LEVOTHROID) 150 MCG tablet Take 150 mcg by mouth daily.  . Multiple Vitamins-Minerals (CENTRUM PO) Take 1 tablet by mouth daily.  . sertraline (ZOLOFT) 50 MG tablet Take 50 mg by mouth daily.    . traMADol (ULTRAM) 50 MG tablet Take by mouth every 6 (six) hours as needed.  . iron polysaccharides (NIFEREX) 150 MG capsule Take 1 capsule (150 mg total) by mouth daily.  :  Review of Systems:  Out of a complete 14 point review of systems, all are reviewed and negative with the exception of these symptoms as listed below:   Review of Systems  Constitutional: Positive for fatigue.  HENT: Positive for trouble swallowing.   Eyes:        Double vision,blurred vision  Respiratory:       Snoring  Endocrine: Positive for heat intolerance.       Excessive thrist  Genitourinary:       Frequency of urination  Musculoskeletal:       Joint pain, aching muscles, walking difficulty  Allergic/Immunologic:       Frequent infections  Neurological: Positive for dizziness and tremors.  Psychiatric/Behavioral: The patient is nervous/anxious.        Depression    Objective:  Neurologic Exam  Physical Exam Physical Examination:   Filed Vitals:   07/13/14 1503  BP: 150/73  Pulse: 81  Temp: 97.6 F (36.4 C)   General Examination: The patient was situated in a wheelchair in the treatment room. She appeared to be in mild distress but had no diaphoresis and was breathing with a normal rate. She is tearful and started crying.  HEENT: Normocephalic, atraumatic. Extraocular tracking shows moderate saccadic breakdown without nystagmus noted. There is limitation to upper gaze. There is moderate decrease in eye blink rate. Hearing is intact. Face is symmetric with moderate facial masking and normal facial sensation. She has an intermittent lower lip tremor. Lacey speech is soft. Neck is severely rigid with decreased range of motion. There are no carotid bruits on auscultation. Oropharynx exam reveals moderate mouth dryness.   Chest: is clear to auscultation but she has some end expiratory wheeze bilaterally. I did not appreciate any crackles.  Heart: sounds are regular and normal without murmurs, rubs or gallops noted.   Abdomen: is soft, non-tender and non-distended with normal bowel sounds appreciated on auscultation.  Extremities: There is 1-2+ edema in the distal lower extremities bilaterally with chronic stasis-like changes noted, unchanged from last time .  Skin: is warm and dry with no trophic changes noted.  Musculoskeletal: exam reveals no obvious joint deformities, tenderness or joint swelling or erythema.  Neurologically:   Mental status: The patient is awake and alert, paying fair attention. She is  not able to  provide the history. Lacey Gonzales provides all the history.  Motor exam: Normal bulk, and strength for age is noted. There are no dyskinesias noted. Tone is moderately rigid bilaterally. There is overall moderate bradykinesia.  she has a more consistent right upper extremity resting tremor. I did not have Lacey stand or walk for me.   Assessment and Plan:    In summary, Lacey Gonzales is a 63 year old female with a complex underlying medical history of anemia, COPD, CHF with medical noncompliance, fibromyalgia, depression, hypothyroidism, reflux disease and anxiety, who presents for follow-up consultation of Lacey parkinsonism after a recent hospitalization. While in our treatment room and awaiting to be seen by me she started complaining of chest pain and some shortness of breath. She is tearful. Not sure if this is of ischemic chest pain or not but we called EMS especially as she has a history of medical noncompliance and CHF and recent hospitalization for exacerbation of Lacey CHF. EMS came and evaluated Lacey and will take Lacey to the hospital. I will see Lacey back soon. I talked to [facility] at length today in private. We also contacted Lacey power of attorney Lacey Gonzales.

## 2014-07-13 NOTE — ED Notes (Signed)
Patient discharged home.

## 2014-07-13 NOTE — ED Notes (Signed)
Pt placed on bedpan. Pt unable to urinate at this time 

## 2014-07-13 NOTE — ED Notes (Signed)
MD unsure if the patient is going to be admitted.  Will continue to follow up with the MD

## 2014-07-13 NOTE — ED Notes (Signed)
Patient placed on the bedpan

## 2014-07-13 NOTE — ED Notes (Signed)
Patient is asking to go home.  Explained to her that if she feels like her heart is going to explode that she does not need to go home.  States she is unable to feel her ankles and feet.  Able to move her legs without difficulty

## 2014-07-13 NOTE — Telephone Encounter (Signed)
Called Ms Wallace Cullens, home # twice- no answer, called cell # , no answer, lt VM message for return call asap concerning patient

## 2014-07-13 NOTE — ED Notes (Signed)
Pt from [facility]. Pt recently dx with CHF and non compliant with lasix. Pt c/o cp that started while at her follow up neuro apt. Pain radiating to R arm. Pt reports increased diarrhea (has diarrhea at baseline). and nausea. Pt also has copd and has had increased coughing with green sputum. ems administered 324 asa, 1 nitro which relieved her pain to 6/10.

## 2014-07-13 NOTE — Telephone Encounter (Signed)
Spoke with Ms Lacey Gonzales and informed that patient was having Chest pains, and that per Dr Amiya Furbish EMS was called and that patient may be transported to hospital. She said that she had to come from [facility] and will meet patient at hospital. Wanted [place]  to inform caretaker and for her to go to hospital with patient.  Informed caretaker(Mary), caretaker verbalized understanding.

## 2014-07-13 NOTE — ED Provider Notes (Signed)
CSN: [id number]     Arrival date & time 07/13/14  1613 History   First MD Initiated Contact with Patient 07/13/14 1613     Chief Complaint  Patient presents with  . Chest Pain     (Consider location/radiation/quality/duration/timing/severity/associated sxs/prior Treatment) HPI Lacey Gonzales 63 y.o. with a PMH of Parkinson's, depression, CHF, fibromyalgia, GERD, anxiety, and COPD presents to the ED for chest pain. This started when she was at her neurologist's office earlier today. It is pressure like and localized to the right side of her chest. It radiates to her right arm. It is pressure like, and she reports relief with the SL NTG that was given to her by EMS. EMS also gave 324 mg ASA. No nausea. She does complain of diaphoresis. No SOB.   Past Medical History  Diagnosis Date  . CHF (congestive heart failure)   . Fibromyalgia   . Depression   . Parkinson's disease   . Anemia   . COPD (chronic obstructive pulmonary disease)   . Iron deficiency anemia 01/21/2012  . Mild Thrombocytopenia 01/21/2012  . Bipolar disorder 01/21/2012  . Hypothyroidism 01/21/2012    S/P I 131 therapy  . Fibromyalgia 01/21/2012  . Enlarged heart   . GERD (gastroesophageal reflux disease)   . Anxiety    Past Surgical History  Procedure Laterality Date  . Abdominal hysterectomy    . Cholecystectomy    . Exploratory laparotomy    . Colonoscopy  06/11/2011    Procedure: COLONOSCOPY;  Surgeon: Malissa Hippo, MD;  Location: [facility];  Service: Endoscopy;  Laterality: N/A;  . Cardiac catheterization  12/30/2008    showed normal coronaries and monitor which showed some sinus tachycardia and infrequent PACs.   Family History  Problem Relation Age of Onset  . Cancer Father   . Depression Father   . Coronary artery disease Father   . Aneurysm Father   . Hypertension Mother   . Cancer Mother   . Heart attack Brother    History  Substance Use Topics  . Smoking status: Current Every Day Smoker --  0.50 packs/day for 40 years    Types: Cigarettes    Last Attempt to Quit: 11/11/2011  . Smokeless tobacco: Never Used  . Alcohol Use: No   OB History   Grav Para Term Preterm Abortions TAB SAB Ect Mult Living                 Review of Systems  Constitutional: Positive for diaphoresis.  Respiratory: Positive for chest tightness and shortness of breath. Negative for apnea, cough, choking, wheezing and stridor.   Cardiovascular: Positive for chest pain. Negative for palpitations and leg swelling.  Gastrointestinal: Positive for nausea. Negative for vomiting, diarrhea, constipation, blood in stool and rectal pain.  All other systems reviewed and are negative.     Allergies  Sulfur  Home Medications   Prior to Admission medications   Medication Sig Start Date End Date Taking? Authorizing Provider  ALPRAZolam Prudy Feeler) 0.5 MG tablet Take 0.5 mg by mouth at bedtime.    Yes Historical Provider, MD  budesonide-formoterol (SYMBICORT) 160-4.5 MCG/ACT inhaler Inhale 2 puffs into the lungs 2 (two) times daily.   Yes Historical Provider, MD  Carbidopa-Levodopa ER (SINEMET CR) 25-100 MG tablet controlled release Take 1 tablet by mouth 4 (four) times daily. at 7 AM, 11 AM, 3 PM and 7 PM 05/25/14  Yes Huston Foley, MD  carvedilol (COREG) 25 MG tablet Take 25 mg by  mouth 2 (two) times daily with a meal.     Yes Historical Provider, MD  fish oil-omega-3 fatty acids 1000 MG capsule Take 1 g by mouth daily.    Yes Historical Provider, MD  furosemide (LASIX) 40 MG tablet Take 40 mg by mouth daily.   Yes Historical Provider, MD  iron polysaccharides (NIFEREX) 150 MG capsule Take 1 capsule (150 mg total) by mouth daily. 02/14/13 07/13/14 Yes Maurine Minister Kefalas, PA-C  levothyroxine (SYNTHROID, LEVOTHROID) 150 MCG tablet Take 150 mcg by mouth daily.   Yes Historical Provider, MD  sertraline (ZOLOFT) 50 MG tablet Take 50 mg by mouth daily.     Yes Historical Provider, MD  tiotropium (SPIRIVA) 18 MCG inhalation  capsule Place 18 mcg into inhaler and inhale daily.   Yes Historical Provider, MD  traMADol (ULTRAM) 50 MG tablet Take by mouth every 6 (six) hours as needed.   Yes Historical Provider, MD  traMADol (ULTRAM) 50 MG tablet Take 1 tablet (50 mg total) by mouth every 6 (six) hours as needed for moderate pain. 07/13/14   Sena Hitch, MD   BP 143/59  Pulse 76  Temp(Src) 98.3 F (36.8 C) (Oral)  Resp 24  Ht  (1.499 m)  Wt 162 lb (73.483 kg)  BMI 32.70 kg/m2  SpO2 98% Physical Exam  Constitutional: She is oriented to person, place, and time. She appears well-developed and well-nourished. No distress.  HENT:  Head: Normocephalic and atraumatic.  Right Ear: External ear normal.  Left Ear: External ear normal.  Eyes: Conjunctivae and EOM are normal. Right eye exhibits no discharge. Left eye exhibits no discharge.  Neck: Normal range of motion. Neck supple. No JVD present.  Cardiovascular: Normal rate, regular rhythm and normal heart sounds.  Exam reveals no gallop and no friction rub.   No murmur heard. Pulmonary/Chest: Effort normal and breath sounds normal. No stridor. No respiratory distress. She has no wheezes. She has no rales. She exhibits no tenderness.  Abdominal: Soft. Bowel sounds are normal. She exhibits no distension. There is no tenderness. There is no rebound and no guarding.  Musculoskeletal: Normal range of motion. She exhibits no edema.  Neurological: She is alert and oriented to person, place, and time.  Resting pill rolling tremor on the right  Skin: Skin is warm. No rash noted. She is not diaphoretic.  Psychiatric: She has a normal mood and affect. Her behavior is normal.    ED Course  Procedures (including critical care time) Labs Review Labs Reviewed  CBC WITH DIFFERENTIAL - Abnormal; Notable for the following:    HCT 35.5 (*)    All other components within normal limits  PRO B NATRIURETIC PEPTIDE - Abnormal; Notable for the following:    Pro B Natriuretic  peptide (BNP) 181.4 (*)    All other components within normal limits  I-STAT CHEM 8, ED - Abnormal; Notable for the following:    BUN 31 (*)    Creatinine, Ser 1.20 (*)    Calcium, Ion 1.08 (*)    All other components within normal limits  I-STAT TROPOININ, ED  Rosezena Sensor, ED    Imaging Review Dg Chest 2 View  07/13/2014   CLINICAL DATA:  Chest pain radiating into the right arm. Productive cough. Current smoker.  EXAM: CHEST  2 VIEW  COMPARISON:  02/24/2012, 10/29/2009.  FINDINGS: AP semi-erect and lateral views were obtained. Suboptimal inspiration accounts for crowded bronchovascular markings, especially in the bases, and accentuates the cardiac silhouette. Taking this  into account, cardiac silhouette upper normal in size. Thoracic aorta atherosclerotic, unchanged. Hilar and mediastinal contours otherwise unremarkable. Lungs otherwise clear. No localized airspace consolidation. No pleural effusions. No pneumothorax. Normal pulmonary vascularity. Compression fracture of T7, progressive since 2013.  IMPRESSION: Suboptimal inspiration. No acute cardiopulmonary disease. T7 compression fracture, progressive since 2013.   Electronically Signed   By: Hulan Saas M.D.   On: 07/13/2014 17:25     EKG Interpretation   Date/Time:  Thursday July 13 2014 16:22:48 EDT Ventricular Rate:  81 PR Interval:  133 QRS Duration: 88 QT Interval:  381 QTC Calculation: 442 R Axis:   5 Text Interpretation:  Sinus rhythm Low voltage, precordial leads Confirmed  by ZAVITZ  MD, JOSHUA ([id number]) on 07/13/2014 4:27:48 PM Also confirmed by  Jodi Mourning  MD, JOSHUA ([id number])  on 07/13/2014 4:43:49 PM Also confirmed by Jodi Mourning   MD, JOSHUA ([id number])  on 07/13/2014 6:03:36 PM      MDM   Final diagnoses:  Chest pain, unspecified chest pain type  Knee pain, bilateral   Pt presents with chest pain. Typical features. Initial trop neg. No dynamic ischemic ECG changes here. Consulted medicine.   Medicine evaluated the  patient, Dr. Julian Reil, and the patient c/o mostly of her bilateral knee pain related to OA. She denied any specific exertional symptoms, diaphoresis. She did not want to stay in the hospital for a stress test. She wanted her pain treated with narcotics.  Although the patient has a flat affect (she has Parkinson's disease), and has a pil rolling tremor - she is not suicidal, and has no plan, and is not homicidal and does not have a plan. No psychotic features. Safe and stable to dc home. HEART score is 3. Delta trops negative. CXR negf or PNA, PTX< or mediastinal widening. She has tremadol at home but two pills remaining. Gave small rx for tramadol for her pain. She an appointment with her PCP tomorrow for follow up of her chronic pain. She will need to be re evaluated tomorrow to determine if stress testing is needed, which she understands.   Strong return precautions given for worsening symptoms or any other alarming or concerning symptoms or issues. The patient was in agreement with the treatment plan and I answered all of their questions. The patient was stable for dc. At dc, the patient ambulated without difficulty, was moving all four extremities, symptoms improved, NAD. and AOx4 Care discussed with my attending, Dr. Jodi Mourning. If performed and available, imaging studies and labs reviewed.     Sena Hitch, MD 07/14/14 [phone]

## 2014-07-13 NOTE — ED Notes (Signed)
Attempted report 

## 2014-07-13 NOTE — Discharge Instructions (Signed)
Arthritis, Nonspecific °Arthritis is pain, redness, warmth, or puffiness (inflammation) of a joint. The joint may be stiff or hurt when you move it. One or more joints may be affected. There are many types of arthritis. Your doctor may not know what type you have right away. The most common cause of arthritis is wear and tear on the joint (osteoarthritis). °HOME CARE  °· Only take medicine as told by your doctor. °· Rest the joint as much as possible. °· Raise (elevate) your joint if it is puffy. °· Use crutches if the painful joint is in your leg. °· Drink enough fluids to keep your pee (urine) clear or pale yellow. °· Follow your doctor's diet instructions. °· Use cold packs for very bad joint pain for 10 to 15 minutes every hour. Ask your doctor if it is okay for you to use hot packs. °· Exercise as told by your doctor. °· Take a warm shower if you have stiffness in the morning. °· Move your sore joints throughout the day. °GET HELP RIGHT AWAY IF:  °· You have a fever. °· You have very bad joint pain, puffiness, or redness. °· You have many joints that are painful and puffy. °· You are not getting better with treatment. °· You have very bad back pain or leg weakness. °· You cannot control when you poop (bowel movement) or pee (urinate). °· You do not feel better in 24 hours or are getting worse. °· You are having side effects from your medicine. °MAKE SURE YOU:  °· Understand these instructions. °· Will watch your condition. °· Will get help right away if you are not doing well or get worse. °Document Released: 01/21/2010 Document Revised: 04/27/2012 Document Reviewed: 01/21/2010 °[employer]® Patient Information ©2015 [employer]. This information is not intended to replace advice given to you by your health care provider. Make sure you discuss any questions you have with your health care provider. ° °

## 2014-07-13 NOTE — Consult Note (Signed)
[facility] Medical Consultation  WILDER AMODEI ZOX:[id number] DOB: 1951/05/15 DOA: 07/13/2014 PCP: Tommy Rainwater, MD   Requesting physician: EDP Date of consultation: 07/13/14 Reason for consultation: Chest pain  Impression/Recommendations Principal Problem:   Chest pain Active Problems:   Bipolar disorder   Fibromyalgia   Anxiety state, unspecified   Depressive disorder, not elsewhere classified    1. Chest pain: Heart score = 3 with 1 point for age and 2 points for risk factors.  Strongly doubt cardiac etiology: patient has chest wall tenderness, a host of other complaints including generalized weakness, "knees buckling", hot flashes, palpitations, and constellation of symptoms that sounds suspiciously like anxiety, panic attacks, and depression.  Her biggest single complaint right now is "feeling boxed in" followed by "knee pain".  Additionally she has not seen her psychiatrist in "quite a while".  9/12 is also the first anniversary of her daughters death from MI last year. 1. I offered the patient and her HCPOA who is at bedside, overnight obs to obtain stress test in the morning; however, while there is a chance the patient does indeed have CAD which we would find on stress test, I had to tell them that I believed that this would be lower yield and the primary reason for doing this stress test would be to try and reassure the patient more than diagnose new CAD.  Patient and HCPOA (who has had stress test before), declined and would rather speak to psychiatry for now.  Recommend a 2nd troponin be drawn in ED, if negative or If they change their mind, please let me know and Ill put them in for observation / stress test. 2. BPD, anxiety, depressive disorder - Far more impressive to me are the patients psychiatric findings, she has a very flat affect, fully admits she is anxious and depressed but has had questionable compliance with medication because her sister who she lives with  "dosent believe" in those meds.  Would advise psychiatric consultation to try and tease out more of what is indeed going on.    Chief Complaint: Chest pain  HPI:  Ms. Haltiwanger is a 63 yo F who presents to the ED with multiple complaints including chest pain, inability to walk, leg swelling, and "feeling boxed in".  Last week she presented to [facility] and was admitted for acute on chronic diastolic CHF.  She was treated with lasix (which she admits she didn't take today at home), and was sent home.  She states she hasnt felt well in the past 2 weeks.  Today while visiting her neurologists office she was sent to the ED for mid chest pressure and pain with radiation to her right arm.  Squeezing sensation.    Review of Systems:  Positive for diarrhea, nausea, cough with green sputum, depression, knee and hip pain.  Past Medical History  Diagnosis Date  . CHF (congestive heart failure)   . Fibromyalgia   . Depression   . Parkinson's disease   . Anemia   . COPD (chronic obstructive pulmonary disease)   . Iron deficiency anemia 01/21/2012  . Mild Thrombocytopenia 01/21/2012  . Bipolar disorder 01/21/2012  . Hypothyroidism 01/21/2012    S/P I 131 therapy  . Fibromyalgia 01/21/2012  . Enlarged heart   . GERD (gastroesophageal reflux disease)   . Anxiety    Past Surgical History  Procedure Laterality Date  . Abdominal hysterectomy    . Cholecystectomy    . Exploratory laparotomy    . Colonoscopy  06/11/2011  Procedure: COLONOSCOPY;  Surgeon: Malissa Hippo, MD;  Location[facility];  Service: Endoscopy;  Laterality: N/A;  . Cardiac catheterization  12/30/2008    showed normal coronaries and monitor which showed some sinus tachycardia and infrequent PACs.   Social History:  reports that she has been smoking Cigarettes.  She has a 20 pack-year smoking history. She has never used smokeless tobacco. She reports that she does not drink alcohol or use illicit drugs.  Allergies  Allergen  Reactions  . Sulfur Swelling   Family History  Problem Relation Age of Onset  . Cancer Father   . Depression Father   . Coronary artery disease Father   . Aneurysm Father   . Hypertension Mother   . Cancer Mother   . Heart attack Brother     Prior to Admission medications   Medication Sig Start Date End Date Taking? Authorizing Provider  ALPRAZolam Prudy Feeler) 0.5 MG tablet Take 0.5 mg by mouth at bedtime.    Yes Historical Provider, MD  budesonide-formoterol (SYMBICORT) 160-4.5 MCG/ACT inhaler Inhale 2 puffs into the lungs 2 (two) times daily.   Yes Historical Provider, MD  Carbidopa-Levodopa ER (SINEMET CR) 25-100 MG tablet controlled release Take 1 tablet by mouth 4 (four) times daily. at 7 AM, 11 AM, 3 PM and 7 PM 05/25/14  Yes Huston Foley, MD  carvedilol (COREG) 25 MG tablet Take 25 mg by mouth 2 (two) times daily with a meal.     Yes Historical Provider, MD  fish oil-omega-3 fatty acids 1000 MG capsule Take 1 g by mouth daily.    Yes Historical Provider, MD  furosemide (LASIX) 40 MG tablet Take 40 mg by mouth daily.   Yes Historical Provider, MD  iron polysaccharides (NIFEREX) 150 MG capsule Take 1 capsule (150 mg total) by mouth daily. 02/14/13 07/13/14 Yes Maurine Minister Kefalas, PA-C  levothyroxine (SYNTHROID, LEVOTHROID) 150 MCG tablet Take 150 mcg by mouth daily.   Yes Historical Provider, MD  sertraline (ZOLOFT) 50 MG tablet Take 50 mg by mouth daily.     Yes Historical Provider, MD  tiotropium (SPIRIVA) 18 MCG inhalation capsule Place 18 mcg into inhaler and inhale daily.   Yes Historical Provider, MD  traMADol (ULTRAM) 50 MG tablet Take by mouth every 6 (six) hours as needed.   Yes Historical Provider, MD   Physical Exam: Blood pressure 120/44, pulse 74, temperature 98.3 F (36.8 C), temperature source Oral, resp. rate 26, height  (1.499 m), weight 73.483 kg (162 lb), SpO2 97.00%. Filed Vitals:   07/13/14 2015  BP: 120/44  Pulse: 74  Temp:   Resp: 26    General:  NAD,  resting comfortably in bed Eyes: PEERLA EOMI ENT: mucous membranes moist Neck: supple w/o JVD Cardiovascular: RRR w/o MRG, chest wall tenderness Respiratory: CTA B Abdomen: soft, nt, nd, bs+ Skin: no rash nor lesion Musculoskeletal: MAE, full ROM all 4 extremities Psychiatric: patient is extremely depressed with flat affect, also likely an anxiety component as well, see discussion in a/p Neurologic: AAOx3, grossly non-focal  Labs on Admission:  Basic Metabolic Panel:  Recent Labs Lab 07/13/14 1701  NA 137  K 4.0  CL 103  GLUCOSE 97  BUN 31*  CREATININE 1.20*   Liver Function Tests: No results found for this basename: AST, ALT, ALKPHOS, BILITOT, PROT, ALBUMIN,  in the last 168 hours No results found for this basename: LIPASE, AMYLASE,  in the last 168 hours No results found for this basename: AMMONIA,  in the last 168 hours CBC:  Recent Labs Lab 07/13/14 1655 07/13/14 1701  WBC 8.0  --   NEUTROABS 5.7  --   HGB 12.3 12.2  HCT 35.5* 36.0  MCV 86.6  --   PLT 152  --    Cardiac Enzymes: No results found for this basename: CKTOTAL, CKMB, CKMBINDEX, TROPONINI,  in the last 168 hours BNP: No components found with this basename: POCBNP,  CBG: No results found for this basename: GLUCAP,  in the last 168 hours  Radiological Exams on Admission: Dg Chest 2 View  07/13/2014   CLINICAL DATA:  Chest pain radiating into the right arm. Productive cough. Current smoker.  EXAM: CHEST  2 VIEW  COMPARISON:  02/24/2012, 10/29/2009.  FINDINGS: AP semi-erect and lateral views were obtained. Suboptimal inspiration accounts for crowded bronchovascular markings, especially in the bases, and accentuates the cardiac silhouette. Taking this into account, cardiac silhouette upper normal in size. Thoracic aorta atherosclerotic, unchanged. Hilar and mediastinal contours otherwise unremarkable. Lungs otherwise clear. No localized airspace consolidation. No pleural effusions. No pneumothorax. Normal  pulmonary vascularity. Compression fracture of T7, progressive since 2013.  IMPRESSION: Suboptimal inspiration. No acute cardiopulmonary disease. T7 compression fracture, progressive since 2013.   Electronically Signed   By: Hulan Saas M.D.   On: 07/13/2014 17:25    EKG: Independently reviewed.  Time spent: 80 min  GARDNER, JARED M. [facility] Pager [phone]  If 7PM-7AM, please contact night-coverage [email or web address] Password [email or web address] 07/13/2014, 8:20 PM

## 2014-07-15 NOTE — ED Provider Notes (Signed)
Medical screening examination/treatment/procedure(s) were conducted as a shared visit with non-physician practitioner(s) or resident and myself. I personally evaluated the patient during the encounter and agree with the findings.  I have personally reviewed any xrays and/ or EKG's with the provider and I agree with interpretation.  Patient presented from neurology office with chest pressure with radiation to the right arm. Nitroglycerin did relieve her pain. Started at rest. No significant weight gain or worsening leg swelling or call. No fevers or chills. Patient has very slight discomfort at this time. Heart failure history and taking her meds. On exam few crackles at bases, no distress, heart regular rate and rhythm, very mild leg swelling bilateral nontender. Plan for cardiac workup, no recent surgeries or blood clot history. HEART 3.  Chest pain   Enid Skeens, MD 07/15/14 [phone]

## 2014-07-20 ENCOUNTER — Ambulatory Visit: Payer: Self-pay | Admitting: Family Medicine

## 2014-07-30 ENCOUNTER — Emergency Department (HOSPITAL_COMMUNITY)
Admission: EM | Admit: 2014-07-30 | Discharge: 2014-07-30 | Disposition: A | Discharge status: Home / Self Care | Payer: Medicare HMO | Attending: Emergency Medicine | Admitting: Emergency Medicine

## 2014-07-30 ENCOUNTER — Encounter (HOSPITAL_COMMUNITY): Payer: Self-pay | Admitting: Emergency Medicine

## 2014-07-30 DIAGNOSIS — R5381 Other malaise: Secondary | ICD-10-CM | POA: Insufficient documentation

## 2014-07-30 DIAGNOSIS — R5383 Other fatigue: Principal | ICD-10-CM

## 2014-07-30 DIAGNOSIS — F17200 Nicotine dependence, unspecified, uncomplicated: Secondary | ICD-10-CM | POA: Insufficient documentation

## 2014-07-30 DIAGNOSIS — F0390 Unspecified dementia without behavioral disturbance: Secondary | ICD-10-CM | POA: Insufficient documentation

## 2014-07-30 DIAGNOSIS — E039 Hypothyroidism, unspecified: Secondary | ICD-10-CM | POA: Diagnosis not present

## 2014-07-30 DIAGNOSIS — I509 Heart failure, unspecified: Secondary | ICD-10-CM | POA: Insufficient documentation

## 2014-07-30 DIAGNOSIS — F411 Generalized anxiety disorder: Secondary | ICD-10-CM | POA: Insufficient documentation

## 2014-07-30 DIAGNOSIS — IMO0002 Reserved for concepts with insufficient information to code with codable children: Secondary | ICD-10-CM | POA: Insufficient documentation

## 2014-07-30 DIAGNOSIS — D509 Iron deficiency anemia, unspecified: Secondary | ICD-10-CM | POA: Insufficient documentation

## 2014-07-30 DIAGNOSIS — J4489 Other specified chronic obstructive pulmonary disease: Secondary | ICD-10-CM | POA: Insufficient documentation

## 2014-07-30 DIAGNOSIS — G20A1 Parkinson's disease without dyskinesia, without mention of fluctuations: Secondary | ICD-10-CM | POA: Insufficient documentation

## 2014-07-30 DIAGNOSIS — Z8719 Personal history of other diseases of the digestive system: Secondary | ICD-10-CM | POA: Insufficient documentation

## 2014-07-30 DIAGNOSIS — J449 Chronic obstructive pulmonary disease, unspecified: Secondary | ICD-10-CM | POA: Diagnosis not present

## 2014-07-30 DIAGNOSIS — M79609 Pain in unspecified limb: Secondary | ICD-10-CM | POA: Insufficient documentation

## 2014-07-30 DIAGNOSIS — G20 Parkinson's disease: Secondary | ICD-10-CM | POA: Insufficient documentation

## 2014-07-30 DIAGNOSIS — F319 Bipolar disorder, unspecified: Secondary | ICD-10-CM | POA: Diagnosis not present

## 2014-07-30 DIAGNOSIS — Z79899 Other long term (current) drug therapy: Secondary | ICD-10-CM | POA: Insufficient documentation

## 2014-07-30 DIAGNOSIS — R531 Weakness: Secondary | ICD-10-CM

## 2014-07-30 HISTORY — DX: Dementia with Lewy bodies: G31.83

## 2014-07-30 HISTORY — DX: Dementia in other diseases classified elsewhere, unspecified severity, without behavioral disturbance, psychotic disturbance, mood disturbance, and anxiety: F02.80

## 2014-07-30 LAB — CBC WITH DIFFERENTIAL/PLATELET
Basophils Absolute: 0 10*3/uL (ref 0.0–0.1)
Basophils Relative: 0 % (ref 0–1)
Eosinophils Absolute: 0.2 10*3/uL (ref 0.0–0.7)
Eosinophils Relative: 2 % (ref 0–5)
HCT: 35.6 % — ABNORMAL LOW (ref 36.0–46.0)
HEMOGLOBIN: 12.2 g/dL (ref 12.0–15.0)
Lymphocytes Relative: 18 % (ref 12–46)
Lymphs Abs: 1.3 10*3/uL (ref 0.7–4.0)
MCH: 29.9 pg (ref 26.0–34.0)
MCHC: 34.3 g/dL (ref 30.0–36.0)
MCV: 87.3 fL (ref 78.0–100.0)
Monocytes Absolute: 0.5 10*3/uL (ref 0.1–1.0)
Monocytes Relative: 7 % (ref 3–12)
NEUTROS ABS: 5.1 10*3/uL (ref 1.7–7.7)
Neutrophils Relative %: 73 % (ref 43–77)
Platelets: 147 10*3/uL — ABNORMAL LOW (ref 150–400)
RBC: 4.08 MIL/uL (ref 3.87–5.11)
RDW: 14.3 % (ref 11.5–15.5)
WBC: 7 10*3/uL (ref 4.0–10.5)

## 2014-07-30 LAB — URINALYSIS, ROUTINE W REFLEX MICROSCOPIC
Bilirubin Urine: NEGATIVE
Glucose, UA: NEGATIVE mg/dL
Hgb urine dipstick: NEGATIVE
Ketones, ur: NEGATIVE mg/dL
LEUKOCYTES UA: NEGATIVE
NITRITE: NEGATIVE
PH: 6 (ref 5.0–8.0)
Protein, ur: NEGATIVE mg/dL
SPECIFIC GRAVITY, URINE: 1.01 (ref 1.005–1.030)
UROBILINOGEN UA: 0.2 mg/dL (ref 0.0–1.0)

## 2014-07-30 LAB — BASIC METABOLIC PANEL
Anion gap: 11 (ref 5–15)
BUN: 18 mg/dL (ref 6–23)
CHLORIDE: 98 meq/L (ref 96–112)
CO2: 29 mEq/L (ref 19–32)
Calcium: 9.2 mg/dL (ref 8.4–10.5)
Creatinine, Ser: 1.03 mg/dL (ref 0.50–1.10)
GFR calc non Af Amer: 57 mL/min — ABNORMAL LOW (ref >90)
GFR, EST AFRICAN AMERICAN: 67 mL/min — AB (ref >90)
Glucose, Bld: 104 mg/dL — ABNORMAL HIGH (ref 70–99)
Potassium: 3.7 mEq/L (ref 3.7–5.3)
Sodium: 138 mEq/L (ref 137–147)

## 2014-07-30 MED ORDER — HYDROCODONE-ACETAMINOPHEN 5-325 MG PO TABS
2.0000 | ORAL_TABLET | Freq: Once | ORAL | Status: AC
  Administered 2014-07-30: 2 via ORAL
  Filled 2014-07-30: qty 2

## 2014-07-30 NOTE — ED Provider Notes (Signed)
CSN: [id number]     Arrival date & time 07/30/14  1940 History   First MD Initiated Contact with Patient 07/30/14 1946   This chart was scribed for Toy Baker, MD by Gwenevere Abbot, ED scribe. This patient was seen in room [facility]19/[facility] and the patient's care was started at 8:04 PM.    Chief Complaint  Patient presents with  . Weakness  . Leg Pain   The history is provided by the patient. No language interpreter was used.   HPI Comments:  Lacey Gonzales is a 63 y.o. female with a h/o Parkinson's Disease who presents to the Emergency Department complaining of weakness and increased leg pain. Pt reports that she experiences pain when she attempts to ambulate, and that her legs feel like theyre going to "explode." Pt reports that she has been experiencing these symptoms for a while, however today is worse than normal. Pt reports that she takes lasix and ambulates with a walker. Pt denies falling in the last 24 hours. Pt reports that she usually takes pain medication, but she is unaware of what medication. Pt reports that she  has bone spurs in her knees and that she has an Occupational psychologist in [place]. Pt reports that she also has an appointment to see orthopedic office on Friday.    Past Medical History  Diagnosis Date  . CHF (congestive heart failure)   . Fibromyalgia   . Depression   . Parkinson's disease   . Anemia   . COPD (chronic obstructive pulmonary disease)   . Iron deficiency anemia 01/21/2012  . Mild Thrombocytopenia 01/21/2012  . Bipolar disorder 01/21/2012  . Hypothyroidism 01/21/2012    S/P I 131 therapy  . Fibromyalgia 01/21/2012  . Enlarged heart   . GERD (gastroesophageal reflux disease)   . Anxiety    Past Surgical History  Procedure Laterality Date  . Abdominal hysterectomy    . Cholecystectomy    . Exploratory laparotomy    . Colonoscopy  06/11/2011    Procedure: COLONOSCOPY;  Surgeon: Malissa Hippo, MD;  Location: [facility];  Service: Endoscopy;   Laterality: N/A;  . Cardiac catheterization  12/30/2008    showed normal coronaries and monitor which showed some sinus tachycardia and infrequent PACs.   Family History  Problem Relation Age of Onset  . Cancer Father   . Depression Father   . Coronary artery disease Father   . Aneurysm Father   . Hypertension Mother   . Cancer Mother   . Heart attack Brother    History  Substance Use Topics  . Smoking status: Current Every Day Smoker -- 0.50 packs/day for 40 years    Types: Cigarettes    Last Attempt to Quit: 11/11/2011  . Smokeless tobacco: Never Used  . Alcohol Use: No   OB History   Grav Para Term Preterm Abortions TAB SAB Ect Mult Living                 Review of Systems  Musculoskeletal: Positive for arthralgias and myalgias.  Neurological: Positive for weakness.  All other systems reviewed and are negative.   Allergies  Sulfur  Home Medications   Prior to Admission medications   Medication Sig Start Date End Date Taking? Authorizing Provider  ALPRAZolam Prudy Feeler) 0.5 MG tablet Take 0.5 mg by mouth at bedtime.     Historical Provider, MD  budesonide-formoterol (SYMBICORT) 160-4.5 MCG/ACT inhaler Inhale 2 puffs into the lungs 2 (two) times daily.  Historical Provider, MD  Carbidopa-Levodopa ER (SINEMET CR) 25-100 MG tablet controlled release Take 1 tablet by mouth 4 (four) times daily. at 7 AM, 11 AM, 3 PM and 7 PM 05/25/14   Huston Foley, MD  carvedilol (COREG) 25 MG tablet Take 25 mg by mouth 2 (two) times daily with a meal.      Historical Provider, MD  fish oil-omega-3 fatty acids 1000 MG capsule Take 1 g by mouth daily.     Historical Provider, MD  furosemide (LASIX) 40 MG tablet Take 40 mg by mouth daily.    Historical Provider, MD  iron polysaccharides (NIFEREX) 150 MG capsule Take 1 capsule (150 mg total) by mouth daily. 02/14/13 07/13/14  Ellouise Newer, PA-C  levothyroxine (SYNTHROID, LEVOTHROID) 150 MCG tablet Take 150 mcg by mouth daily.    Historical  Provider, MD  sertraline (ZOLOFT) 50 MG tablet Take 50 mg by mouth daily.      Historical Provider, MD  tiotropium (SPIRIVA) 18 MCG inhalation capsule Place 18 mcg into inhaler and inhale daily.    Historical Provider, MD  traMADol (ULTRAM) 50 MG tablet Take by mouth every 6 (six) hours as needed.    Historical Provider, MD  traMADol (ULTRAM) 50 MG tablet Take 1 tablet (50 mg total) by mouth every 6 (six) hours as needed for moderate pain. 07/13/14   Sena Hitch, MD   BP 148/56  Pulse 91  Temp(Src) 98.7 F (37.1 C) (Oral)  Resp 16  Ht  (1.499 m)  Wt 160 lb (72.576 kg)  BMI 32.30 kg/m2  SpO2 99% Physical Exam  Nursing note and vitals reviewed. Constitutional: She is oriented to person, place, and time. She appears well-developed and well-nourished.  Non-toxic appearance. No distress.  HENT:  Head: Normocephalic and atraumatic.  Eyes: Conjunctivae, EOM and lids are normal. Pupils are equal, round, and reactive to light.  Neck: Normal range of motion. Neck supple. No tracheal deviation present. No mass present.  Cardiovascular: Normal rate, regular rhythm and normal heart sounds.  Exam reveals no gallop.   No murmur heard. 1+ bilateral pitting edema.  Pulmonary/Chest: Effort normal and breath sounds normal. No stridor. No respiratory distress. She has no decreased breath sounds. She has no wheezes. She has no rhonchi. She has no rales.  Abdominal: Soft. Normal appearance and bowel sounds are normal. She exhibits no distension. There is no tenderness. There is no rebound and no CVA tenderness.  Musculoskeletal: Normal range of motion. She exhibits no edema and no tenderness.  Neurological: She is alert and oriented to person, place, and time. She has normal strength. No cranial nerve deficit or sensory deficit. GCS eye subscore is 4. GCS verbal subscore is 5. GCS motor subscore is 6.  Neurovascularly intact. Pt has a resting tremor.  Skin: Skin is warm and dry. No abrasion and no  rash noted.  Psychiatric: She has a normal mood and affect. Her speech is normal and behavior is normal.    ED Course  Procedures  DIAGNOSTIC STUDIES: Oxygen Saturation is 99% on RA, normal by my interpretation.  COORDINATION OF CARE: 8:08 PM-Discussed treatment plan which includes  (CXR, CBC panel, UA) with pt at bedside and pt agreed to plan.  Labs Review Labs Reviewed - No data to display  Imaging Review No results found.   EKG Interpretation None      MDM   Final diagnoses:  None    I personally performed the services described in this documentation, which was  scribed in my presence. The recorded information has been reviewed and is accurate.  9:53 PM Labs without acute abnormality here. Stable for discharge    Toy Baker, MD 07/30/14 2153

## 2014-07-30 NOTE — Discharge Instructions (Signed)

## 2014-07-30 NOTE — ED Notes (Signed)
Called out EMS for weakness and increased leg pain.  Feels "llike my legs will give out".  Wants something to help pain.  EMS reports patient found out today that her niece was killed.

## 2014-08-01 LAB — URINE CULTURE

## 2014-10-07 ENCOUNTER — Observation Stay: Payer: Self-pay | Admitting: Internal Medicine

## 2014-10-07 LAB — CBC
HCT: 36.9 % (ref 35.0–47.0)
HGB: 12.4 g/dL (ref 12.0–16.0)
MCH: 30.4 pg (ref 26.0–34.0)
MCHC: 33.7 g/dL (ref 32.0–36.0)
MCV: 90 fL (ref 80–100)
PLATELETS: 140 10*3/uL — AB (ref 150–440)
RBC: 4.09 10*6/uL (ref 3.80–5.20)
RDW: 14.1 % (ref 11.5–14.5)
WBC: 6.9 10*3/uL (ref 3.6–11.0)

## 2014-10-07 LAB — COMPREHENSIVE METABOLIC PANEL
ALT: 14 U/L
Albumin: 3.9 g/dL (ref 3.4–5.0)
Alkaline Phosphatase: 94 U/L
Anion Gap: 6 — ABNORMAL LOW (ref 7–16)
BILIRUBIN TOTAL: 0.6 mg/dL (ref 0.2–1.0)
BUN: 17 mg/dL (ref 7–18)
CALCIUM: 8.9 mg/dL (ref 8.5–10.1)
CHLORIDE: 105 mmol/L (ref 98–107)
CO2: 28 mmol/L (ref 21–32)
CREATININE: 1.16 mg/dL (ref 0.60–1.30)
EGFR (African American): >60
GFR CALC NON AF AMER: 50 — AB
GLUCOSE: 96 mg/dL (ref 65–99)
Osmolality: 279 (ref 275–301)
Potassium: 4.3 mmol/L (ref 3.5–5.1)
SGOT(AST): 18 U/L (ref 15–37)
Sodium: 139 mmol/L (ref 136–145)
Total Protein: 7.3 g/dL (ref 6.4–8.2)

## 2014-10-07 LAB — URINALYSIS, COMPLETE
Bilirubin,UR: NEGATIVE
Blood: NEGATIVE
Glucose,UR: NEGATIVE mg/dL (ref 0–75)
Hyaline Cast: 2
Ketone: NEGATIVE
Nitrite: NEGATIVE
Ph: 5 (ref 4.5–8.0)
Protein: NEGATIVE
RBC,UR: 3 /HPF (ref 0–5)
SPECIFIC GRAVITY: 1.011 (ref 1.003–1.030)
WBC UR: 26 /HPF (ref 0–5)

## 2014-10-07 LAB — CK TOTAL AND CKMB (NOT AT ARMC)
CK, TOTAL: 110 U/L (ref 26–192)
CK, TOTAL: 85 U/L (ref 26–192)
CK, Total: 87 U/L (ref 26–192)
CK-MB: 7.6 ng/mL — ABNORMAL HIGH (ref 0.5–3.6)
CK-MB: 5.3 ng/mL — AB (ref 0.5–3.6)
CK-MB: 4.6 ng/mL — ABNORMAL HIGH (ref 0.5–3.6)

## 2014-10-07 LAB — PROTIME-INR
INR: 1
Prothrombin Time: 13.5 secs (ref 11.5–14.7)

## 2014-10-07 LAB — TROPONIN I: Troponin-I: <0.02 ng/mL

## 2014-10-07 LAB — PRO B NATRIURETIC PEPTIDE: B-Type Natriuretic Peptide: 272 pg/mL — ABNORMAL HIGH (ref 0–125)

## 2014-10-07 LAB — LIPASE, BLOOD: LIPASE: 116 U/L (ref 73–393)

## 2014-10-07 LAB — APTT: Activated PTT: 31.1 secs (ref 23.6–35.9)

## 2014-10-08 LAB — CBC WITH DIFFERENTIAL/PLATELET
BASOS PCT: 0.8 %
Basophil #: 0 10*3/uL (ref 0.0–0.1)
EOS PCT: 4.6 %
Eosinophil #: 0.2 10*3/uL (ref 0.0–0.7)
HCT: 35.4 % (ref 35.0–47.0)
HGB: 12.1 g/dL (ref 12.0–16.0)
LYMPHS PCT: 29.9 %
Lymphocyte #: 1.6 10*3/uL (ref 1.0–3.6)
MCH: 30.7 pg (ref 26.0–34.0)
MCHC: 34.2 g/dL (ref 32.0–36.0)
MCV: 90 fL (ref 80–100)
Monocyte #: 0.4 x10 3/mm (ref 0.2–0.9)
Monocyte %: 7.7 %
Neutrophil #: 3.1 10*3/uL (ref 1.4–6.5)
Neutrophil %: 57 %
PLATELETS: 137 10*3/uL — AB (ref 150–440)
RBC: 3.94 10*6/uL (ref 3.80–5.20)
RDW: 14.4 % (ref 11.5–14.5)
WBC: 5.4 10*3/uL (ref 3.6–11.0)

## 2014-10-08 LAB — BASIC METABOLIC PANEL
ANION GAP: 6 — AB (ref 7–16)
BUN: 20 mg/dL — ABNORMAL HIGH (ref 7–18)
CALCIUM: 8.7 mg/dL (ref 8.5–10.1)
CO2: 30 mmol/L (ref 21–32)
Chloride: 102 mmol/L (ref 98–107)
Creatinine: 1.16 mg/dL (ref 0.60–1.30)
EGFR (Non-African Amer.): 50 — ABNORMAL LOW
Glucose: 80 mg/dL (ref 65–99)
OSMOLALITY: 277 (ref 275–301)
POTASSIUM: 3.6 mmol/L (ref 3.5–5.1)
Sodium: 138 mmol/L (ref 136–145)

## 2014-10-08 LAB — LIPID PANEL
Cholesterol: 144 mg/dL (ref 0–200)
HDL Cholesterol: 42 mg/dL (ref 40–60)
LDL CHOLESTEROL, CALC: 76 mg/dL (ref 0–100)
TRIGLYCERIDES: 132 mg/dL (ref 0–200)
VLDL Cholesterol, Calc: 26 mg/dL (ref 5–40)

## 2014-10-09 LAB — URINE CULTURE

## 2014-10-12 LAB — CULTURE, BLOOD (SINGLE)

## 2014-10-20 ENCOUNTER — Other Ambulatory Visit: Payer: Self-pay

## 2014-10-20 MED ORDER — CARBIDOPA-LEVODOPA ER 25-100 MG PO TBCR: 1.0000 | EXTENDED_RELEASE_TABLET | Freq: Four times a day (QID) | ORAL | Status: AC

## 2014-10-23 ENCOUNTER — Ambulatory Visit: Payer: Medicare HMO | Admitting: Neurology

## 2014-10-26 ENCOUNTER — Emergency Department: Payer: Self-pay | Admitting: Emergency Medicine

## 2014-10-26 LAB — URINALYSIS, COMPLETE
Bilirubin,UR: NEGATIVE
Blood: NEGATIVE
Glucose,UR: NEGATIVE mg/dL (ref 0–75)
Leukocyte Esterase: NEGATIVE
Nitrite: NEGATIVE
Ph: 5 (ref 4.5–8.0)
RBC,UR: 1 /HPF (ref 0–5)
Specific Gravity: 1.026 (ref 1.003–1.030)
Squamous Epithelial: 9

## 2014-10-26 LAB — COMPREHENSIVE METABOLIC PANEL
ALT: 12 U/L — AB
ANION GAP: 3 — AB (ref 7–16)
Albumin: 4 g/dL (ref 3.4–5.0)
Alkaline Phosphatase: 103 U/L
BUN: 31 mg/dL — ABNORMAL HIGH (ref 7–18)
Bilirubin,Total: 0.5 mg/dL (ref 0.2–1.0)
CHLORIDE: 105 mmol/L (ref 98–107)
Calcium, Total: 8.6 mg/dL (ref 8.5–10.1)
Co2: 33 mmol/L — ABNORMAL HIGH (ref 21–32)
Creatinine: 1.17 mg/dL (ref 0.60–1.30)
EGFR (African American): >60
GFR CALC NON AF AMER: 50 — AB
Glucose: 80 mg/dL (ref 65–99)
Osmolality: 287 (ref 275–301)
POTASSIUM: 4.1 mmol/L (ref 3.5–5.1)
SGOT(AST): 20 U/L (ref 15–37)
Sodium: 141 mmol/L (ref 136–145)
Total Protein: 7.7 g/dL (ref 6.4–8.2)

## 2014-10-26 LAB — CBC
HCT: 36.4 % (ref 35.0–47.0)
HGB: 12.4 g/dL (ref 12.0–16.0)
MCH: 30.8 pg (ref 26.0–34.0)
MCHC: 34.2 g/dL (ref 32.0–36.0)
MCV: 90 fL (ref 80–100)
Platelet: 154 10*3/uL (ref 150–440)
RBC: 4.04 10*6/uL (ref 3.80–5.20)
RDW: 14.2 % (ref 11.5–14.5)
WBC: 6 10*3/uL (ref 3.6–11.0)

## 2014-10-26 LAB — DRUG SCREEN, URINE
Amphetamines, Ur Screen: NEGATIVE (ref ?–1000)
BENZODIAZEPINE, UR SCRN: POSITIVE (ref ?–200)
Barbiturates, Ur Screen: NEGATIVE (ref ?–200)
Cannabinoid 50 Ng, Ur ~~LOC~~: NEGATIVE (ref ?–50)
Cocaine Metabolite,Ur ~~LOC~~: NEGATIVE (ref ?–300)
MDMA (Ecstasy)Ur Screen: NEGATIVE (ref ?–500)
METHADONE, UR SCREEN: NEGATIVE (ref ?–300)
OPIATE, UR SCREEN: NEGATIVE (ref ?–300)
Phencyclidine (PCP) Ur S: NEGATIVE (ref ?–25)
TRICYCLIC, UR SCREEN: NEGATIVE (ref ?–1000)

## 2014-10-26 LAB — SALICYLATE LEVEL: Salicylates, Serum: <1.7 mg/dL

## 2014-10-26 LAB — ETHANOL: Ethanol: <3 mg/dL

## 2014-10-26 LAB — ACETAMINOPHEN LEVEL

## 2014-10-29 LAB — URINE CULTURE

## 2015-03-02 NOTE — Discharge Summary (Signed)
PATIENT NAME:  Lacey Gonzales, Lacey Gonzales MR#:  [id number] DATE OF BIRTH:  [birth year]  DATE OF ADMISSION:  07/21/2013 DATE OF DISCHARGE:  07/25/2013  CONSULTANTS:  1.  Palliative care, Dr. Harvie Junior.  2.  Physical therapy.   CHIEF COMPLAINT: Chest pain.   PRIMARY CARE PHYSICIAN: Dr. Phillips Odor.   DISCHARGE DIAGNOSES: 1.  Chest pain, likely musculoskeletal in nature.  2.  History of chronic diastolic congestive heart failure.  3.  Anxiety.  4.  Depression.  5.  Fibromyalgia.  6.  Hypothyroidism.  7.  Iron deficiency anemia.  8.  Chronic obstructive pulmonary disease exacerbation, acute.  9.  Constipation.  10.  Tobacco abuse.  11.  History of coronary artery disease, with history of myocardial infarction.  12.  History of coronary artery disease, with a history of myocardial infarction.  13.  Chronic obstructive pulmonary disease.  14.  Lewy body dementia.   DISCHARGE MEDICATIONS:  1.  Carvedilol 37.5 mg 2 times a day.  2.  Promethazine 25 mg every 6 hours as needed for nausea.  3.  Levothyroxine 125 mcg daily.  4.  Lasix 40 mg once a day as needed.  5.  Savella 50 mg 2 times a day.  6.  Benzonatate 100 mg 2 times a day.  7.  Meloxicam 15 mg once a day.  8.  Aspirin 81 mg daily.  9.  Sertraline 50 mg at bedtime and 100 mg in the morning.  10.  Seroquel 50 mg once a day in the morning and 150 mg at bedtime. 11.  Norco 325/5, 1 tablet every 6 hours as needed for pain.  12.  Alprazolam 0.5 mg, 1 tablet 4 times a day.  13.  Spiriva 18 mcg inhaled one capsule daily.  14.  Docusate senna, 1 tablet 2 times a day.   DIET: Low-sodium, low-fat, low-cholesterol.   ACTIVITY: As tolerated.   FOLLOWUP: Please follow with PCP within 1 to 2 weeks.   DISPOSITION: To [facility].   HISTORY OF PRESENT ILLNESS AND HOSPITAL COURSE: For full details of H and P please see the dictation on September 11 by Dr. Clint Guy, but briefly this is a 64 year old female with diastolic CHF, COPD, Lewy body  dementia, who came in for chest pain and was admitted to the hospitalist service for further evaluation and management.   Initial EKG and enzymes were within normal limits. She was started on aspirin. Remained on telly. She was ruled out for acute coronary syndrome, and her pain was more musculoskeletal in nature. She was started on aspirin, nitro, a beta blocker, statin. An echocardiogram was obtained showing no significant EF ejection drop, and this was more consistent with diastolic CHF. She was ruled out for acute coronary syndrome. She was not in acute CHF, however.   She was going to be discharged, however unfortunately her caretaker, her daughter, who usually has usually been taking care of had an unfortunate event and passed, and therefore the patient was not able to be taken care of as an outpatient. Case management was involved. She did have a COPD flare and was started on some breathing treatments and Spiriva for COPD. She was seen by PT, and at this point will be getting discharged to a rehab facility.   She did have constipation, and had resolved with a bowel regimen. She was counseled for her tobacco abuse.   PHYSICAL EXAMINATION: VITAL SIGNS: On the day of discharge, she was afebrile. Temperature was 98.4, pulse rate 72, respiratory rate  20, blood pressure 141/81, O2 sat 99% on room air.   GENERAL: The patient is an obese female sitting on a chair, no obvious distress.  HEENT: Normocephalic, atraumatic. Pupils are equal. Moist mucous membranes.  NECK: Supple. CARDIOVASCULAR: S1, S2. Regular rate and rhythm. No murmurs.  LUNGS: Clear.  ABDOMEN: Benign. Positive bowel sounds.  EXTREMITIES: Show no lower extremity edema.   At this point, she will be discharged to a rehab facility for further care.   Total time spent: Thirty-five minutes.     ____________________________ Krystal EatonShayiq Kasumi Ditullio, MD sa:dm D: 07/25/2013 14:36:42 ET T: 07/25/2013 15:26:42 ET JOB#: [id number]  cc: Krystal EatonShayiq  Toni Hoffmeister, MD, <Dictator> Dr. Hardie LoraGolding Nayelis Bonito [facility] Hospital[facility]ADZ[facility] MD ELECTRONICALLY SIGNED 08/02/2013 14:06

## 2015-03-02 NOTE — H&P (Signed)
PATIENT NAME:  Lacey Gonzales, Lacey Gonzales MR#:  [id number] DATE OF BIRTH:  [birth year]  DATE OF ADMISSION:  07/20/2013  REFERRING PHYSICIAN:  Dr. Dolores FrameSung.   PRIMARY CARE PHYSICIAN:  Dr. Phillips OdorGolding.    CARDIOLOGIST:  Dr. Rennis GoldenHilty out of [place].    HISTORY OF PRESENT ILLNESS:  This is a 64 year old Caucasian female with past medical history of Lewy-body dementia, congestive heart failure of undocumented type, coronary artery disease as well as COPD who has presented with acute onset chest pain which occurred at rest.  It was retrosternal in nature, described as a dull pressure with radiation to the left arm, intensity 5 to 7 out of 10.  No worsening or relieving factors, present for approximately 3 to 4 hours.  She has had associated shortness of breath as well as 1 or 2 weeks of orthopnea as well as lower extremity edema which is chronic in nature for her.  She denies any prior anginal symptoms.  Currently, she is asymptomatic.  She was given nitroglycerin in the Emergency Department with relief of symptoms.  Her blood pressure dropped from 170 systolic into the high 90s.  Nitro was then removed.   REVIEW OF SYSTEMS: CONSTITUTIONAL:  Denies fevers, chills, fatigue.  EYES:  Denies blurred vision or pain.  EARS, NOSE, THROAT:  Denies tinnitus or ear pain.  CARDIOVASCULAR:  Chest pain as above as well as edema.  PULMONARY:  She denies cough or wheeze, but does mention shortness of breath as above.  GASTROINTESTINAL:  Denies nausea, vomiting, diarrhea.  GENITOURINARY:  Denies dysuria or hematuria.  ENDOCRINE:  Denies thyroid problems or nocturia.  HEMATOLOGY AND LYMPHATIC:  Denies easy bruising or bleeding.  SKIN:  Denies any rashes or lesions.  MUSCULOSKELETAL:  Positive for bilateral knee pain which is chronic in nature secondary to arthritis.  NEUROLOGIC:  Denies paralysis or paresthesias.  PSYCHIATRIC:  Denies anxiety or depression.   PAST MEDICAL HISTORY:  Has Lewy-body dementia, congestive heart failure  undocumented type, coronary artery disease with history of myocardial infarction, COPD, fibromyalgia, osteoarthritis of the knees, hypothyroidism, anxiety and depression not otherwise specified.   FAMILY HISTORY:  Significant for cancer of unknown type in both her mother and sister.  She denies any heart disease.   SOCIAL HISTORY:  She denies any alcohol use.  Positive tobacco history, currently smoking 1/2 pack a day.  She is ambulatory at baseline, though does need help with some activities of daily living from her daughter including showering.   ALLERGIES:  SULFA DRUGS.   HOME MEDICATIONS:  Alprazolam 0.5 mg by mouth 4 times daily, Coreg 25 mg by mouth 1-1/2 tabs twice daily, benzonatate 100 mg capsule by mouth twice daily,  Lasix 40 mg by mouth daily as needed for edema, Synthroid 125 mcg by mouth daily, Lunesta 3 mg by mouth at bedtime, meloxicam 15 mg by mouth daily, Norco 325/5 mg by mouth q. 6 hours as needed for pain, promethazine 25 mg by mouth q. 6 hours as needed for nausea, vomiting, Savella 50 mg by mouth twice daily and Sertraline 50 mg by mouth daily.   PHYSICAL EXAMINATION: VITAL SIGNS:  Temperature 98, heart rate 73, respirations 20, blood pressure 91/46, saturating 95% on room air.  Weight 68.9 kg.  BMI of 29.7.  GENERAL:  No acute distress.  Awake, alert and oriented x 3.  Affect is blunted.  HEENT:  Normocephalic, atraumatic.  Extraocular muscles intact.  Pupils equal, round, reactive to light as well as accommodation.  Moist mucosal  membranes.  No JVD noted.  CARDIOVASCULAR:  S1, S2, regular rate and rhythm.  No murmurs, rubs, or gallops.  PULMONARY:  Bibasilar crackles with good air entry bilaterally.  ABDOMEN:  Soft, nontender, nondistended with positive bowel sounds.  Obese.  EXTREMITIES:  Revealed trace edema bilaterally without cyanosis or clubbing.  NEUROLOGIC:  Cranial nerves II through XII intact.  No gross neurological deficits.   LABORATORY DATA:  Sodium 138,  potassium 4.4, chloride 104, bicarb 28, BUN 23, creatinine 0.96, BNP 439, troponin less than 0.02.  WBC 6.2, hemoglobin 13.3, hematocrit 37.8.  Urinalysis negative for infection.   EKG:  Normal sinus rhythm.  No ST or T abnormalities.   ASSESSMENT AND PLAN:  A 64 year old Caucasian female with past medical history of Lewy-body dementia, congestive heart failure of undocumented type, coronary artery disease, chronic obstructive pulmonary disease who is presenting with acute onset chest pain at rest, retrosternal in nature with radiation to the left arm.  She has been given aspirin from EMS.  1.  Chest pain.  Initial EKG and enzymes were within normal limits.  She has been given Lovenox in the Emergency Department as well has received aspirin from EMS.  Trending cardiac enzymes.  Check a transthoracic echocardiogram.  Given typical presentation and pattern as well as risk factor should benefit from stress testing if enzymes are indeed normal.  2.  Chronic congestive heart failure.  On exam she is mild fluid-overloaded.  Would give Lasix, but the patient is hypotensive at this time after nitroglycerin.  Continue Coreg.  There is no acute need for diuretics.  3.  Hypothyroidism.  Continue with a home dose of Synthroid.    4.  Anxiety, depression, not otherwise specified.  Continue with Zoloft.  5.  Chronic pain secondary to osteoarthritis.  Continue home dose of Norco.  6.  The patient is FULL CODE.   Total time spent 33 minutes.    ____________________________ Cletis Athens. Deo Mehringer, MD dkh:ea D: 07/21/2013 02:16:11 ET T: 07/21/2013 02:59:58 ET JOB#: [id number]  cc: Cletis Athens. Braelynn Lupton, MD, <Dictator> Jubilee Vivero Synetta Shadow MD ELECTRONICALLY SIGNED 07/22/2013 1:21

## 2015-03-03 NOTE — Consult Note (Signed)
PATIENT NAME:  Roland RackFULTON, Hermelinda W MR#:  [id number] DATE OF BIRTH:  1951-06-11  DATE OF CONSULTATION:  02/24/2014  CONSULTING PHYSICIAN:  Liliane Mallis D. Juliann Paresallwood, MD  REFERRING PHYSICIAN: Dr. Heron NayVasireddy.   PRIMARY PHYSICIAN:  Dr. Carlynn PurlSowles.    INDICATION: Chest pain, weakness, atrial fibrillation.   HISTORY OF PRESENT ILLNESS: The patient presents with multiple medical problems including hypertension, hyperlipidemia, diabetes, came to the Emergency Room because of pain in the neck, shoulder, since this morning.  Patient was not feeling well for the last few days, whole left side had pressure feeling and pain. The patient also states that she had had some chills and mild cough, came to the Emergency Room. EKG, cardiac enzymes were normal. The patient also stated that also experienced palpitations. An EKG showed atrial flutter and subsequently converted back to sinus rhythm.   PAST MEDICAL HISTORY: Chronic diastolic congestive heart failure, anxiety, depression, fibromyalgia, hypothyroidism deficiency anemia, COPD, constipation, tobacco abuse, coronary artery disease, Lewy body dementia.   ALLERGIES: SULFA.   MEDICATIONS: Symbicort 80 mcg 2 puffs twice a day, sertraline 50 mg twice a day, Seroquel 100 mg at bedtime, trazadone 50 mg once a day, Savella  50 mg twice a day, Remeron 25 mg once a day, Norco 5/325 a half tablet at bedtime, lisinopril 10 mg a day, Synthroid 125 mcg daily, Lasix 40 a day, potassium daily, gabapentin 200 mg 4 times a day, senna plus 1 tablet 2 times a day, Coreg 25 twice a day, carbidopa 25 four times a day,  100 mg twice a day, aspirin 81 mg a day.   SOCIAL HISTORY:  Continues to smoke. No drinking or alcohol consumption. Lives with  sister-in-law.  Daughter died of a heart attack.   FAMILY HISTORY:  Cardiac arrest, sudden death, cancer.   REVIEW OF SYSTEMS:  No blackout spells or syncope. No nausea or vomiting. No fever, no chills, no sweats. No loss weight gain, hemoptysis,  hematemesis. No bright red blood per rectum.  No vision change or hearing change. No sputum production. No cough. She is complaining of vague chest pain, tremors, weakness, dementia, trouble with memory.   PHYSICAL EXAMINATION:  VITAL SIGNS:  Blood pressure is 100/50, pulse of 90, respiratory rate of 16, afebrile.  HEENT:  Normocephalic, atraumatic. Pupils equal and reactive to light.  NECK:  Supple. No significant JVD, bruits or adenopathy.  LUNGS:  Clear to auscultation. No significant wheeze, rhonchi, or rale.  HEART: Regular rate and rhythm. No significant murmur, gallops, or rubs.  ABDOMEN:  Benign.  EXTREMITIES:  Within normal limits.  NEUROLOGIC:  Intact.  SKIN:  Normal.   LABORATORIES:  UA 3+ bacteria. CT of the head negative. Troponin less than 0.02. Troponin is also negative. Lipid profile negative. Cardiac enzymes were negative. EKG shows atrial flutter on EKG #1, but subsequently had normal sinus rhythm.   ASSESSMENT: Chest pain, diabetes, atrial flutter, weakness, polypharmacy, obesity, dementia.   PLAN: Agree with admit. Rule out for myocardial infarction. Followup cardiac enzymes. Followup EKGs. Treat the patient medically. I do not recommend any further studies at this point. Echocardiogram may be helpful. Continue diabetes medications. Repeat EKG. Recommend weight loss and exercise. Advised the patient to quit smoking. We will continue to follow patient.  I am not sure that long-term anticoagulation is recommended, but we will see how the patient does. She has known coronary disease and status post stent placement, we will treat the patient medically for now.   ____________________________ Bobbie Stackwayne D. Juliann Paresallwood, MD  ddc:ja D: 02/24/2014 14:55:55 ET T: 02/24/2014 16:38:30 ET JOB#: [id number]  cc: Ketina Mars D. Juliann Pares, MD, <Dictator> Alwyn Pea MD ELECTRONICALLY SIGNED 03/29/2014 13:50
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
## 2015-03-03 NOTE — H&P (Signed)
PATIENT NAME:  Lacey Gonzales, Lacey Gonzales MR#:  [id number] DATE OF BIRTH:  [birth year]  DATE OF ADMISSION:  10/07/2014  PRIMARY CARE PHYSICIAN: Onnie Boer. Carlynn Purl, MD  REFERRING EMERGENCY ROOM PHYSICIAN: Eryka A. Inocencio Homes, MD   CHIEF COMPLAINT: Dizziness and chest pain.   HISTORY OF PRESENT ILLNESS: The patient is a 64 year old Caucasian female with multiple medical problems, including coronary artery disease and Parkinson disease presenting to the ED with a chief complaint of chest pain, dizziness, and abdominal discomfort. The patient was doing fine until today and at around 12 noon she started having left-sided chest pain, stabbing in nature, radiating to the right side of the chest associated with some shortness of breath. The pain was also radiating to the right shoulder; constant in nature. The patient is reporting that the pain was getting worse with movements, turning, and getting better if she is resting. She sees Dr. Juliann Pares as an outpatient. Recently did not have any stress test done. Also complaining of dizziness, but denies any passing out. The patient was initially diaphoretic when she started chest pain. She is requesting to give her pain medicine as the pain is worsening with movements. Complaining of abdominal discomfort and dysuria. Denies any fever. Denies any passing out. No other complaints.   PAST MEDICAL HISTORY: Parkinson disease, coronary artery disease, COPD, chronic diastolic heart failure. Echocardiogram in April has revealed normal ejection fraction. Anxiety with depression, fibromyalgia, hypothyroidism, Lewy body dementia, tobacco abuse.   PAST SURGICAL HISTORY: Coronary artery stent placement.   ALLERGIES: SULFA.  PSYCHOSOCIAL HISTORY: She lives with her sister-in-law. The patient smokes 2 cigarettes every other day. Denies alcohol or illicit drug usage.   FAMILY HISTORY: Daughter deceased with heart attack at age 59. Positive for cancer and coronary artery disease.   REVIEW OF  SYSTEMS:  CONSTITUTIONAL: Denies any fever, fatigue. Complaining of weakness.  EYES: Denies blurry vision, double vision, or glaucoma.  ENT: Denies epistaxis, discharge, seasonal allergies. Positive for snoring.  RESPIRATORY: Denies cough. She has COPD.  CARDIOVASCULAR: Complaining of chest pain, stabbing in nature, reproducible. No palpitations. Complaining of near syncope.  GASTROINTESTINAL: Has nausea. Denies any vomiting, diarrhea. Complaining of lower abdominal discomfort. Denies any melena or hematemesis.  GENITOURINARY: Complaining of dysuria, frequency, and urgency.  ENDOCRINE: Denies polyuria, nocturia, or thyroid problems.  HEMATOLOGIC AND LYMPHATIC: Denies any easy bruising or bleeding.  INTEGUMENTARY: No acne, rash, lesions.  MUSCULOSKELETAL: Limited activities from Parkinson disease and fibromyalgia. Ambulates with a walker.  NEUROLOGIC: History of Parkinson disease and Lewy body dementia. PSYCHIATRIC: She has a history of anxiety and depression.   HOME MEDICATIONS: Carbidopa-levodopa 25/100 mg extended release 1 tablet p.o. 4 times a day, alprazolam 0.5 mg 1 tablet p.o. 3 times a day, Coreg 12.5 mg 1 tablet p.o. 2 times a day, furosemide 40 mg p.o. once daily, gabapentin 100 mg 1 capsule p.o. 2 times a day in the morning, at lunch, and then 3 capsules at bedtime, levothyroxine 1.5 mcg p.o. once daily, mirtazapine 15 mg 0.5 tablet p.o. once daily at bedtime, potassium chloride 20 mEq p.o. once daily with furosemide, prednisone 20 mg 3 tablets p.o. once daily,  Zoloft 100 mg p.o. once daily, Symbicort 2 puffs inhalation 2 times a day, Spiriva 1 capsule inhalation once daily, tramadol 50 mg p.o. once daily as needed for pain, vitamin B12 one tablet p.o. once daily, vitamin C 500 mg 1 capsule p.o. once daily, vitamin D3 at 2000 international units 1 capsule p.o. once daily.   PHYSICAL EXAMINATION:  VITAL SIGNS: Temperature 98.9, pulse 77, respirations 18, blood pressure 125/72, pulse  oximetry 100% on room air.  GENERAL APPEARANCE: Not in acute distress. Moderately built and nourished.  HEENT: Normocephalic, atraumatic. Pupils are equally reacting to light and accommodation. No scleral icterus. No conjunctival injection. No sinus tenderness. No postnasal drip. Moist mucous membranes.  NECK: Supple. No JVD. No thyromegaly. Range of motion is intact.  LUNGS: Clear to auscultation bilaterally. No accessory muscle use. Positive anterior chest wall tenderness on palpation which is reproducible, more reproducible on the left side of the chest.  GASTROINTESTINAL: Soft. Bowel sounds are positive in all 4 quadrants. Positive suprapubic tenderness, but there were no masses felt. No rebound tenderness. No flank tenderness. NEUROLOGIC: Awake, alert, and oriented x 3. Positive resting tremors from Parkinson's. Cranial nerves II through XII are intact. Motor and sensory are intact. Reflexes are 2+.  EXTREMITIES: Pitting edema 1+. No cyanosis, no clubbing.  SKIN: Warm to touch. Normal turgor. No rashes. No lesions.  MUSCULOSKELETAL: No joint effusion, tenderness, erythema.  PSYCHIATRIC: Flat mood and affect.   LABORATORY AND IMAGING STUDIES: Urinalysis: Yellow in color, cloudy in appearance, nitrites negative, leukocyte esterase 1+. PT and INR are normal. Troponin less than 0.02. WBC,  hemoglobin, and hematocrit are normal. Platelet count is at 140,000.  B-natriuretic peptide is 272. Anion gap 6. GFR 50. Rest of the BMP is normal. Lipase is normal. Twelve-lead EKG with a lot of artifact because of the severe tremors; unable to determine the rhythm; rate at 80 beats per minute. Portable chest x-ray: No active cardiopulmonary disease.   ASSESSMENT AND PLAN: A 64 year old female with history of diastolic congestive heart failure who is coming to the Emergency Department with a chief complaint of chest pain and dizziness. The patient is also complaining of abdominal discomfort in the lower abdominal  area associated with urinary frequency.  1.  Chest pain with history of coronary artery disease, atypical as the chest pain is reproducible, probably musculoskeletal in nature. We will rule out acute myocardial infarction in view of her age and other risk factors, including coronary artery disease. We will admit her to telemetry. Cycle cardiac biomarkers. Consult cardiology if troponins are trending. We will obtain an echocardiogram. We will provide her aspirin, beta blocker, and statin.  2.  Near syncope, probably from acute cystitis. We will get orthostatics and obtain echocardiogram. More workup based on the clinical situation.  3.  Acute cystitis. We will check urine culture and sensitivity and provide her intravenous Rocephin.  4.  History of Parkinson disease. Continue carbidopa-levodopa.  5.  History of diastolic congestive heart failure. We will get an echocardiogram. The patient will be on aspirin, beta blocker, statin, Lasix, and potassium supplements.  6.  Chronic history of chronic obstructive pulmonary disease. Continue her home inhaler, Spiriva, and we will provide her nebulizer treatments as needed basis.  7.  History of anxiety and depression. Denies any suicidal or homicidal ideations at this time. We will continue her home medication, Zoloft.  8.  We will provide her gastrointestinal prophylaxis.  9.  The patient has nicotine abuse. Refusing nicotine patch. Counseled the patient to quit smoking for 4 to 5 minutes. She verbalized understanding. The patient wants to quit smoking on her own.  10.  She is DO NOT RESUSCITATE. Daughter is the medical power of attorney.  11.  We will provide gastrointestinal and deep vein thrombosis prophylaxis.  12.  Plan of care discussed with the patient. She verbalized understanding of  the plan.   TOTAL TIME SPENT: 50 minutes.   ____________________________ Ramonita LabAruna Kenndra Morris, MD ag:ts D: 10/07/2014 18:55:47 ET T: 10/07/2014 21:38:25  ET JOB#: [id number]  cc: Ramonita LabAruna Jataya Wann, MD, <Dictator> Ramonita LabARUNA Kayceon Oki MD ELECTRONICALLY SIGNED 10/26/2014 16:45

## 2015-03-03 NOTE — H&P (Signed)
PATIENT NAME:  Lacey Gonzales, [name] MR#:  [id number] DATE OF BIRTH:  October 31, 1951  DATE OF ADMISSION:  02/23/2014  PRIMARY CARE PHYSICIAN: Dr. Alba CoryKrichna Sowles.    REFERRING PHYSICIAN: Dr. Sharma CovertNorman.   CHIEF COMPLAINT: Chest pain, tenderness, weakness.   HISTORY OF PRESENT ILLNESS: The patient with a past medical history of multiple medical problems, including hypertension, hyperlipidemia, diabetes mellitus. Presented to the Emergency Department with the complaints of pain in the neck and shoulder, started since 10 p.m. the night before. The patient was not feeling well for the last few days. Felt as if the whole left side of the body had pressure-like pain. The patient also states that had some chills. Has mild cough.  Workup in the Emergency Department with the EKG and cardiac enzymes were completely unremarkable. The patient also stated that patient was experiencing palpitations. Initial EKG showed atrial flutter. However, repeat EKG showed converted back to normal sinus rhythm.   PAST MEDICAL HISTORY:  1. Chronic diastolic congestive heart.  2. Anxiety. 3. Depression.  4. Fibromyalgia.  5. Hypothyroidism.  6. Deficiency anemia.  7. Chronic obstructive pulmonary disease.  8. Constipation.  9. Tobacco use. 10. Coronary artery disease status post a stent placement. 11. Lewy body dementia.   ALLERGIES:  SULFA.   HOME MEDICATIONS:    mcg 1 capsule once daily.  2. Symbicort 80 mcg 2 puffs 2 times a day. 3. Sertraline 50 mg 2 times a day. 4.  Sertraline 50 mg at bedtime.  5. Seroquel 100 mg at bedtime. 6. Seroquel 50 mg once a day. 7. Savella 50 mg 2 times a day. 8. Remeron 25 mg once a day or at bedtime.  9.      every 6 hours as needed.  10. Norco 5/325 mg 1/2 tablet at bedtime.  11.  .  15 mg daily. 12. Lisinopril 10 mg  . 13. Synthroid 125 mcg  once a day.  14. Lasix 40 mg every other day.  15. Potassium. 16. Gabapentin 200 mg 4 times daily. 17. Senna Plus 1 tablet 2 times a  day. 18. Coreg 25 mg 2 times a day.  19. Carbidopa 25 mg 4 times a day.  20. Benzonatate 100 mg capsule 2 times a day.  21. Aspirin 81 mg a day. 22. .   0.5 mg 4 times a day.   SOCIAL HISTORY: Continues to smoke less than pack a day.  Denies drinking alcohol or using illicit drugs. Currently lives with her sister-in-law.  Patient's daughter died 1 year back with sudden cardiac arrest who was her primary caregiver.   FAMILY HISTORY: Daughter died of sudden cardiac arrest. Significant for cancer in both mother and sister.   REVIEW OF SYSTEMS:  CONSTITUTIONAL: Generalized weakness.  EYES: No change in vision.  EARS, NOSE, AND THROAT: No change in hearing. RESPIRATORY: Has mild cough but no productive sputum.  CARDIOVASCULAR: Has chest pain on the left shoulder  and substernal area. GASTROINTESTINAL:  Has mild nausea.  GENITOURINARY: No dysuria or hematuria.   ENDOCRINE: No polydipsia. Has diabetes mellitus.   HEMATOLOGY:   No easy bruising or bleeding. Marland Kitchen.   SKIN: No rash or lesions.  MUSCULOSKELETAL: Generalized body aches.  NEUROLOGICAL:  No weakness.  Has tremors on the right side of the body, worse than usual PSYCHIATRIC:  Has history of depression and anxiety.   PHYSICAL EXAMINATION:  GENERAL: This is a very well developed,  age-appropriate female lying down in the bed,  not in distress.  VITAL  SIGNS: Temperature 97.6, pulse 89, blood pressure 101/49, respiratory rate of 20, oxygen saturation is 96% on room air.  HEENT:  Head is normocephalic, atraumatic.  No scleral icterus. Conjunctivae normal. Pupils equal and reactive. Extraocular movements are intact. Mucous membranes moist. No pharyngeal erythema.  NECK: Supple. No lymphadenopathy. No JVD. No carotid bruit. Short neck and has focal tenderness on left shoulder joint anteriorly and left .  LUNGS: Bilateral clear to auscultation.  HEART:  S1, S2, regular. No murmurs are heard.  ABDOMEN: Bowel sounds are present. Soft, nontender,  nondistended.  EXTREMITIES: No pedal edema. Pulses 2+.  NEUROLOGIC: The patient is alert, oriented to place, person and time. Cranial nerves II-XII intact. Motor 5/5 in upper and lower extremities.  SKIN: No rash or lesions.  MUSCULOSKELETAL: Good range of motion in all the extremities.   LABORATORY AND RADIOLOGICAL DATA:  UA: Nitrites positive, 3+ bacteria. CT head without contrast: No acute intracranial abnormality.  Troponin less than 0.02, CK-MB is 1.3. Troponin less than 0.02. Lipid profile is well within normal limits.  Cardiac enzymes:  CK62, CK-MB less than 0.5.   One of the EKGs shows atrial flutter.  Did repeat EKG.  Normal sinus rhythm with a no ST-T wave abnormalities.     ASSESSMENT AND PLAN: The patient  is a 65 year old female who comes to the Emergency Department with chest pain.  1. Chest pain, rule out with the cardiac enzymes x 3, .   x 2 are already negative. The patient states she usually walks with the help of a walker without having any chest pain.    Rule out the cardiac enzymes. If negative, the patient could be discharged home to follow up with cardiology as an outpatient.  2. Diabetes mellitus. Continue the home medications.  3. Generalized weakness, most likely secondary to debility.  .  4. Polypharmacy. The patient will need to decrease the number of sedative medications.  5. Marked obesity. I counseled with the patient regarding diet and exercise. Patient expressed understanding. 6.  Keep the patient on DVT prophylaxis with  Lovenox.  TIME SPENT: 45 minutes.   ____________________________ Susa Griffins, MD pv:dd D: 02/24/2014 03:56:45 ET T: 02/24/2014 04:28:29 ET JOB#: [id number]  cc: Susa Griffins, MD, <Dictator> Onnie Boer. Carlynn Purl, MD  Susa Griffins MD ELECTRONICALLY SIGNED 02/24/2014 21:18

## 2015-03-03 NOTE — Discharge Summary (Signed)
PATIENT NAME:  Lacey Gonzales, Lacey Gonzales MR#:  [id number] DATE OF BIRTH:  [birth year]  DATE OF ADMISSION:  02/24/2014 DATE OF DISCHARGE:  02/26/2014  PRIMARY CARE PHYSICIAN: Dr. Carlynn Purl  FINAL DIAGNOSES:  1. Epigastric abdominal pain, chest pain.  2. Hypotension.  3. Atrial flutter, converted to normal sinus rhythm.  4. Acute renal failure.  5. Urinary tract infection.  6. Urinary retention.  7. Chronic diastolic congestive heart failure.  8. Anxiety, depression, fibromyalgia.  9. Hypothyroidism. 10.  Chronic obstructive pulmonary disease.  11.  Lewy body dementia. 12.  Parkinson's.   MEDICATIONS ON DISCHARGE: Include levothyroxine 125 mcg daily, Tessalon Perles 100 mg twice a day, Zoloft 50 mg at bedtime, Norco 5/325 half tablet in the a.m. and 1 tablet in the p.m., Spiriva 18 mcg inhalation 1 inhalation daily, Lasix 40 mg every other day with potassium, Coreg 25 mg twice a day, Remeron 25 mg at bedtime, gabapentin 100 mg 2 capsules 4 times a day, Symbicort 84.5 two puffs twice a day, Xanax 0.5 mg 3 times a day, carbidopa levodopa 25/100 one tablet 4 times a day, Zoloft 50 mg 2 tablets in the morning, Seroquel 50 mg 2 tablets at bedtime, cephalexin 250 mg 1 capsule every 8 hours for 3 days, MiraLax 17 grams orally daily, nicotine patch 21 mg per chest wall  1 patch daily, omeprazole 40 mg twice a day.   Stop taking promethazine, meloxicam, aspirin, Colace, senna, hydrochlorothiazide, and lisinopril.   HOME HEALTH: Yes. Physical therapy, nurse, and nurse aide.  DIET CONSISTENCY: Regular low-sodium diet. Follow up 1-2 weeks with Dr. Carlynn Purl.   HOSPITAL COURSE: Patient was admitted 02/23/2014, discharged 02/26/2014. Came in with chest pain and weakness and initially admitted as an observation with chest pain. Laboratory and radiological data during the hospital course included an INR of 1.0, PTT 30.3, TSH 2.29, BNP 163. Troponin negative. Glucose 83, BUN 29, creatinine 1.58, sodium 137, potassium 4.3,  chloride 106, CO2 of 25, calcium 8.8. White blood cell count 8.8, hemoglobin and hematocrit 12.5 and 37.1, platelet count of 205,000. Chest x-ray showed atrial flutter, 86 beats per minute.    EKG showed normal sinus rhythm. Urinalysis: 3+ bacteria, positive nitrites, trace leukocyte esterase. CT scan of the head showed no acute intracranial abnormality.  A urine culture grew out greater than 100,000 Escherichia coli resistant to Bactrim and nitrofurantoin, otherwise sensitive.   Two troponins were negative. LDL 91, HDL 39, triglycerides 110. Echocardiogram showed EF 55% -60%. KUB showed bowel gas pattern, nonspecific. Ultrasound of the kidneys negative. Creatinine upon discharge 1.23. Stool for Clostridium difficile negative. Occult blood positive. Last hemoglobin 10, down from 12.5, but patient was receiving IV fluids during the entire hospital stay.  HOSPITAL COURSE PER PROBLEM LIST:  1. Epigastric abdominal pain and chest pain. I think this was more GI in nature. The patient was seen in consultation by Dr. Juliann Pares, cardiology, who recommended no further cardiac testing. I did start the patient on Protonix here and switched over to omeprazole upon discharge. The patient states that she has a problem with her insurance getting her Nexium. I think omeprazole would be less likely to cause issues. I stopped her aspirin and meloxicam. The guaiac-positive stool is because she had an enema prior to discharge. I do not think she has a GI bleed at this point, but I would hold off on aspirin and meloxicam and continue the proton pump inhibitor. Patient had no chest pain or abdominal pain upon discharge.  2. Relative hypotension.  She was given IV fluids during the entire hospital stay. Blood pressure upon discharge, 120/68. She was kept on her usual Coreg, stopped on her hydrochlorothiazide lisinopril at this point.  She can go back on the Lasix every other day.  3. Atrial flutter, which converted to normal sinus  rhythm, may be related to dehydration. TSH normal range.  4. Acute renal failure. The patient's creatinine improved with IV fluids. Creatinine upon discharge, 1.23, making her chronic kidney disease stage III.  5. Urinary tract infection. Grew out Escherichia coli, was on Rocephin here in the hospital. We will switch over to Keflex upon discharge.  6. Urinary retention. Required a Foley catheter here in the hospital. The patient was urinating after Foley was discontinued. I did have a trial of urecholine prior to removing the Foley and then after removing the Foley.  7. Chronic diastolic congestive heart failure history. No signs of heart failure on this hospital stay. Received IV fluids during the entire hospital stay.  8. Anxiety, depression, fibromyalgia on numerous medications.  9. Hypothyroidism. TSH normal on levothyroxine.  10. Chronic obstructive pulmonary disease. Respiratory status stable.  11. Lewy body dementia and Parkinson's. On numerous medications including carbidopa levodopa and all the psychiatric medications. The patient will be set up with home health as outpatient. She does have her POA that helps take care of her.   TIME SPENT ON DISCHARGE: 35 minutes.   ____________________________ Herschell Dimesichard J. Renae GlossWieting, MD rjw:dd D: 02/26/2014 14:35:41 ET T: 02/26/2014 23:16:36 ET JOB#: [id number]  cc: Herschell Dimesichard J. Renae GlossWieting, MD, <Dictator> Onnie BoerKrichna F. Carlynn PurlSowles, MD   Salley ScarletICHARD J Leondra Cullin MD ELECTRONICALLY SIGNED 03/03/2014 16:58

## 2015-03-03 NOTE — Discharge Summary (Signed)
PATIENT NAME:  Lacey Gonzales, Lacey Gonzales MR#:  [id number] DATE OF BIRTH:  [birth year]  DATE OF ADMISSION:  10/07/2014 DATE OF DISCHARGE:  10/08/2014  ADMISSION DIAGNOSIS: Chest pain.   DISCHARGE DIAGNOSES:  1.  Chest pain, atypical in nature.  2.  History of Parkinson disease.   CONSULTATIONS: None.   PERTINENT LABORATORIES AT DISCHARGE: A 2-D echocardiogram showed EF of 50% to 55% without any mention of valvular abnormalities.   White blood cells 5.4, hemoglobin 12, hematocrit 36, platelet are 137,000. Sodium 138, potassium 3.6, chloride 102, bicarbonate 30, BUN 20, creatinine 1.16. Glucose is 80, LDL 76, cholesterol 144, HDL is 42. Troponins x 3 were negative.   PHYSICAL EXAMINATION AT DISCHARGE:  VITAL SIGNS: The patient's temperature 98.1, pulse 79, respirations 18, blood pressure 111/60, and 96% room air.  GENERAL: The patient is alert, oriented, not in acute distress.  CARDIOVASCULAR: Regular rate and rhythm. There is a 2/6 systolic ejection murmur heard best at the right sternal border without radiation. PMI is not displaced.  LUNGS: Clear to auscultation without crackles, rales, rhonchi or wheezing. Normal to percussion.  ABDOMEN: Bowel sounds are positive. Nontender, nondistended. No hepatosplenomegaly.  EXTREMITIES: No clubbing, cyanosis, edema.  NEUROLOGICAL: The patient does have Parkinson tremor.   HOSPITAL COURSE: A very pleasant 64 year old female who presented with chest pain. For further details, please refer to the H and P.   1.  Chest pain the patient does have a history of CAD. Her chest pain did sound atypical in nature, was reproducible, probably musculoskeletal in nature. She was ruled out for acute coronary syndrome. Her troponins were negative. She does see Dr. Gwen Pounds, so we recommend that the patient see Dr. Gwen Pounds as an outpatient and can be considered for outpatient stress test given her history of CAD, but again no indication that this is an acute coronary event. Her  telemetry was normal. EKG did not show any acute ST elevations or depressions. She had a 2-D echocardiogram which showed normal ejection fraction and no valvular abnormalities. She was continued on aspirin and beta blocker.  2.  Acute cystitis. The patient is on Rocephin and will be discharged with Keflex. So far her urine cultures are too small to be read and blood culture negative to date.  3.  Tobacco dependence. The patient was encouraged to stop smoking.  4.  Fibromyalgia. The patient will continue outpatient medications.  5.  Parkinson disease. The patient was continued on carbidopa/levodopa.   DISCHARGE MEDICATIONS:  1.  Carbidopa/levodopa 25/100 four times a day.  2.  Gabapentin 100 mg 1 capsule b.i.d. and 3 capsules at bedtime.  3.  Zoloft 100 mg daily.  4.  Symbicort 160/4.5 two puffs b.i.d.  5.  Potassium 20 mEq daily with Lasix.  6.  Coreg 12.5 b.i.d.  7.  Xanax 0.5 t.i.d.  8.  Lasix 40 mg half tablet every other day alternating with 1 tablet every other day.  9.  Levothyroxine 125 mcg daily.  10.  Mirtazapine 15 mg 1/2 tablet in the evening.  11.  Spiriva 18 mcg daily.  12.  Vitamin B12 at 1000 mcg daily.  13.  Vitamin C with rose hips 500 mg daily.  14.  Vitamin D3 at 1000 international units daily.  15.  Meloxicam 15 mg daily.  16.  Buspirone 5 mg p.o. b.i.d.  17.  Tylenol 500 mg up to 3 times a day with tramadol as needed for pain. 18.  Tramadol 50 mg up to 3 times a  day with Tylenol as needed for pain.  19.  Aspirin 81 mg daily.  20.  Keflex 500 mg p.o. t.i.d. times 5 days.   DISCHARGE DIET: Low sodium.   DISCHARGE ACTIVITY: As tolerated.   DISCHARGE FOLLOWUP: The patient will follow up next week with Dr. Gwen PoundsKowalski.   TIME SPENT: Approximately 35 minutes.   DISPOSITION: The patient was stable for discharge.    ____________________________ Derrion Tritz P. Juliene PinaMody, MD spm:TT D: 10/08/2014 12:12:58 ET T: 10/08/2014 19:05:52 ET JOB#: [id number]  cc: Shawnie Nicole P. Juliene PinaMody, MD,  <Dictator> Lamar BlinksBruce J. Kowalski, MD Onnie BoerKrichna F. Carlynn PurlSowles, MD Patricia PesaSITAL P Carl Bleecker MD ELECTRONICALLY SIGNED 10/08/2014 20:50

## 2015-03-03 NOTE — Consult Note (Signed)
Psychiatry: Follow-up for this elderly woman with depression and psychotic symptoms.  She says she is still feeling depressed and having suicidal thoughts.  Still feels agitated.  Affect flat.  Not very interactive. are still looking for a geriatric psychiatry unit for this patient.  Meanwhile I will add 50 mg of Seroquel at night.  This should be safe as a treatment for psychotic symptoms in a patient even if she has lewey body dementia.  Continue Remeron.  No other acute change to treatment.  Educational counseling completed.  Electronic Signatures: Clapacs, Jackquline DenmarkJohn T (MD)  (Signed on 18-Dec-15 17:49)  Authored  Last Updated: 18-Dec-15 17:49 by Audery Amellapacs, John T (MD)

## 2015-03-03 NOTE — H&P (Signed)
PATIENT NAME:  Lacey Gonzales, RADLIFF MR#:  [id number] DATE OF BIRTH:  July 08, 1951  DATE OF ADMISSION:  07/05/2014  PRIMARY CARE PHYSICIAN: Alba Cory, MD  CHIEF COMPLAINT: Weakness.   HISTORY OF PRESENT ILLNESS: A 64 year old female was seen yesterday in the ER for weakness, discharged for some reason with prednisone, who presents again for weakness. Also dysuria, frequency, urgency and lower extremity edema. She was here in April for chest pain and weakness as well.   REVIEW OF SYSTEMS:  CONSTITUTIONAL: No fever. Positive for fatigue and weakness. EYES: No blurred, double vision, glaucoma. EARS, NOSE AND THROAT: No ear pain, hearing loss, seasonal allergies. Positive snoring. RESPIRATORY: No cough, wheezing, rhonchi, COPD. CARDIOVASCULAR: No chest pain, no orthopnea. Positive edema. No arrhythmia, , palpitations, syncope. GASTROINTESTINAL: No nausea, vomiting, diarrhea, abdominal pain, melena.  GENITOURINARY: Positive dysuria, frequency and urgency.  ENDOCRINOLOGY: No polyuria or polydipsia.  HEMATOLOGY AND LYMPHATICS: No bleeding or swollen glands.  SKIN: No rash or lesions. MUSCULOSKELETAL: Positive for limited activity. She walks with a walker.  NEUROLOGIC: Positive history of Parkinson disease and Lewy Body Dementia.  PSYCHIATRIC: Anxiety and depression.  PAST MEDICAL HISTORY:  1.  Chronic diastolic heart failure. She had an echocardiogram in April which showed a normal ejection fraction.  2.  Anxiety with depression.  3.  Fibromyalgia.  4.  Hypothyroidism.  5.  Lewy Body Dementia.  6.  History of COPD. 7.  Tobacco abuse.  8.  History of CAD, status post stent. 9.  Parkinson disease.   ALLERGIES: SULFA.   SOCIAL HISTORY: The patient smokes about a pack a day. No alcohol or IV drug use. She lives with her sister-in-law.   FAMILY HISTORY: Positive for cancer and CAD.   MEDICATIONS:  1.  Vitamin D3 at 2000 international units daily. 2.  Vitamin C 1 tablet daily.  3.   Vitamin B12 one tablet daily. 4.  Tramadol 50 mg p.o. daily as needed.  5.  Tiotropium 18 mcg daily. 6.  Symbicort 2 puffs b.i.d.  7.  Zoloft 100 mg daily. 8.  Potassium 20 mEq daily.  9.  Remeron 15 mg 1/2 tablet daily. 10.  Synthroid 125 daily.  11.  Gabapentin 100 mg b.i.d.  12.  Lasix 40 mg daily as needed.  13.  Coreg 25 mg b.i.d.  14.  Carbidopa/levodopa 25 mg/100 one tablet 4 times a day. 15.  Xanax 0.5 mg t.i.d.   PHYSICAL EXAMINATION: VITAL SIGNS: Temperature 98, pulse 94, respirations 21, blood pressure 111/74 and 98% on room air.  GENERAL: The patient is alert, oriented, not in acute distress. HEENT: The head is atraumatic. anicteric sclerae. Mucous membranes are moist. Oropharynx is clear. NECK: Supple without JVD, carotid bruit or enlarged thyroid.  CARDIOVASCULAR: Regular rate and rhythm. No murmurs, gallops or rubs. PMI is not displaced.  LUNGS: Clear to auscultation without crackles, rales, rhonchi or wheezing. Normal to percussion.  ABDOMEN: Bowel sounds are positive. Nontender, nondistended. No hepatosplenomegaly.  EXTREMITIES: Two plus pitting edema bilaterally and symmetrically.  NEUROLOGICAL: Cranial nerves II-XII are intact. She has a tremor from her Lewy Body Dementia and Parkinson disease. SKIN: Without rash or lesions.   LABORATORIES: Chest x-ray shows no evidence of a pneumonia or pulmonary edema. Sodium 140, potassium 3.8, chloride 103, bicarbonate 27, BUN 20, creatinine 1.24, glucose is 137, ALT 9, AST 22, total protein 7.8, albumin 3.8. Troponin less than 0.02. BNP 662. White blood cells 10.6, hemoglobin 12.6, hematocrit 37, platelets are 175,000. Urinalysis shows positive LCE with  21 white blood cells.   EKG is normal sinus rhythm. No ST elevation depression.   ASSESSMENT AND PLAN: A 64 year old female with a history of Lewy Body Dementia, Parkinson disease, chronic diastolic heart failure, presents with weakness, found to have a urinary tract infection and  acute and chronic diastolic heart failure.  1.  Acute on chronic diastolic heart failure. The patient had lower extremity edema contributed to her diastolic heart failure. I started her on Lasix 20 IV q. 12 hours. Will monitor I's and O's and monitor her creatinine function carefully.  2.  Weakness secondary to urinary tract infection. I started the patient on Rocephin for urinary tract infection and urine culture has been ordered. Physical therapy consultation.  3.  History of Parkinson disease and Lewy Body Dementia. We will continue outpatient medications.  4.  Hypothyroidism. We will check TSH. Continue Synthroid.  5.  History of chronic obstructive pulmonary disease, which seems to be stable at this time. We will continue inhalers.  6.  Anxiety and depression. Continue her outpatient medications.   CODE STATUS: The patient is a DNR status.  TIME SPENT: Approximately 35 minutes.   Plan of care was discussed with the patient and the family at bedside.    ____________________________ Janyth ContesSital P. Juliene PinaMody, MD spm:TT D: 07/05/2014 19:28:52 ET T: 07/05/2014 19:54:00 ET JOB#: [id number]  cc: Siler Mavis P. Juliene PinaMody, MD, <Dictator> Onnie BoerKrichna F. Carlynn PurlSowles, MD  Janyth ContesSITAL P Rhyder Koegel MD ELECTRONICALLY SIGNED 07/06/2014 11:17

## 2015-03-03 NOTE — Discharge Summary (Signed)
PATIENT NAME:  Lacey Gonzales, Lacey Gonzales MR#:  [id number] DATE OF BIRTH:  1951/01/15  DATE OF ADMISSION:  07/05/2014 DATE OF DISCHARGE:  07/07/2014  DISCHARGE DIAGNOSES:  1.  Acute on chronic diastolic heart failure.  2.  Generalized weakness.  3.  Parkinson disease. 4.  Chronic obstructive pulmonary disease.  5.  Coronary artery disease.  6.  Anxiety and depression.  7.  Fibromyalgia.   DISCHARGE MEDICATIONS:  1.  Levothyroxine 125 mcg p.o. daily.  2.  Spiriva 18 mcg inhalation daily.  3.  Alprazolam 0.5 mg p.o. t.i.d.  4.  Carbidopa/levodopa combination 25/100 one tablet 4 times daily.  5.  Neurontin 100 mg p.o. 2 times a day in the morning and at lunchtime and 3 capsules at bedtime.  6.  Zoloft 100 mg p.o. daily.  7.  Symbicort 160/4.5 mcg inhalation twice daily.  8.  Vitamin C with rose hips 500 mg p.o. daily.  9.  Tramadol 50 mg p.o. daily.  10. KCl 20 mEq p.o. daily only with Lasix. 11. Mirtazapine 15 mg 0.5 tablet daily at bedtime.  12. Prednisone 20 mg 3 tablets daily.  13. Coreg 12.5 mg p.o. b.i.d.  14. Furosemide 40 mg daily.   HOME HEALTH:  The patient is discharged with home health physical therapy.   HOSPITAL COURSE: This patient is a 64 year old female with history of Parkinson disease, COPD, anxiety with depression, heart failure, hypothyroidism, fibromyalgia,  dementia, comes in because of generalized weakness. The patient also found to have some shaking, dysuria and frequency of urination and lower extremity edema. The patient was seen in the ER on the same day and discharged home with prednisone, but because of generalized weakness the patient was brought in here. The patient admitted to the hospitalist service.  Please look at history and physical for full details. The patient placed on observation status and the only abnormality was her BNP was slightly up at 662.  Urinalysis was showing 21 WBCs and positive leukocyte esterase. The patient did not have any EKG changes. She  was admitted for acute on chronic diastolic heart failure, started on IV Lasix 20 mg q. 12 hours and for her weakness we thought secondary to UTI. She was given Rocephin. Next day patient felt better and her symptoms nicely improved with antibiotics and Lasix. She was seen by physical therapy for generalized weakness and she recommended home physical therapy. The patient's caregiver, her sister-in-law, told me that she has had some tongue rolling inside and she was weak so I was concerned for TIA so we got a CT of the head which showed no acute changes.  The patient neurologically was normal. The patient's symptoms nicely improved and she remained hemodynamically stable. The only abnormality was TSH was 0.210, so I decreased her Synthroid and she was taking 125 mcg and decreased her to 100 mcg daily. The patient he sees Dr. Horton Chin. We advised her to follow up with him and the patient advised to take 100 mcg daily; 125 listed in discharge instructions, advised her to take 100 mcg.   TIME SPENT: More than 30 minutes on dictation.   FAMILY PHYSICIAN:  from [place] .  I advised her to follow with a neurologist regarding Parkinson disease. I felt it is worsening and the patient having tremors constantly so I told her to follow with a neurologist in [place]. She is on carbidopa and levodopa at 25/100 one tablet 4 times daily, and she takes tramadol for pain and she is on  Remeron 15 mg 0.5 tablet daily. For heart failure she is on Coreg 12.5 mg twice daily. She had echo earlier this month which showed EF more than 50%. The patient was taking Lasix 20 mg on as needed basis, but she is on 40 milligrams daily as a discharge medication so we suggest to change that. COPD: She is on Spiriva.  Lungs did not have any wheezing.  Advised her to continue Spiriva and Symbicort.   TIME SPENT: More than 30 minutes.   ____________________________ Katha HammingSnehalatha Mahlet Jergens, MD sk:lt D: 07/09/2014 07:52:57  ET T: 07/09/2014 08:45:13 ET JOB#: [id number]  cc: Katha HammingSnehalatha Kailynne Ferrington, MD, <Dictator> Unknown cc Katha HammingSNEHALATHA Inga Noller MD ELECTRONICALLY SIGNED 07/19/2014 12:29

## 2015-03-29 IMAGING — CR DG CHEST 1V PORT
1 series · 1 of 1 positions shown · non-contrast
Comparison: 07/20/2013

CLINICAL DATA: Shortness of breath, weakness

EXAM:
PORTABLE CHEST - 1 VIEW

[ap]
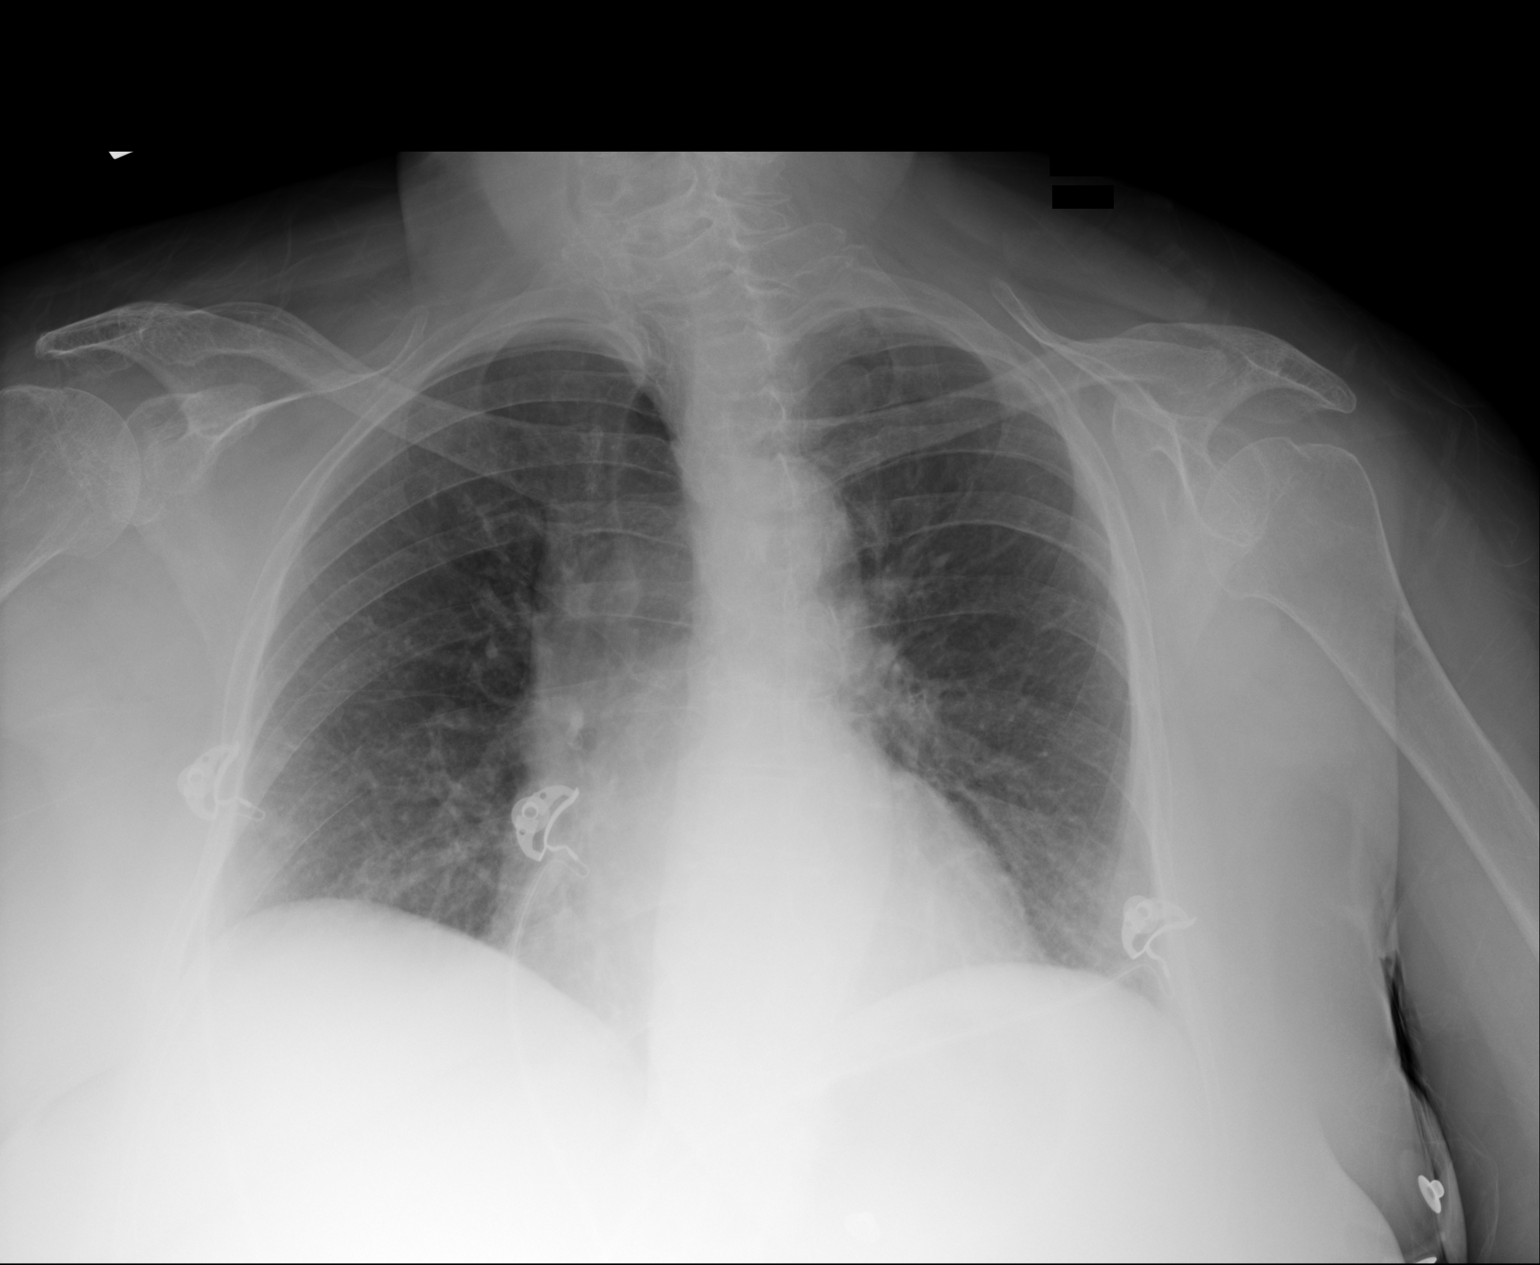

[1 of 1 positions shown; findings below may reference images not displayed]

FINDINGS: Lungs are essentially clear. Mild left basilar atelectasis. No
pleural effusion or pneumothorax.

The heart is top-normal in size.
IMPRESSION: No evidence of acute cardiopulmonary disease.

## 2015-04-13 IMAGING — CR DG KNEE 1-2V*L*
1 series · 2 of 2 positions shown · non-contrast
Comparison: None.

CLINICAL DATA: Knee pain

EXAM:
LEFT KNEE - 1-2 VIEW

[Series 1: kdxr knee left ap and lateral · 0.14mm/px · 2 of 2 slices shown]
[im 1/2]
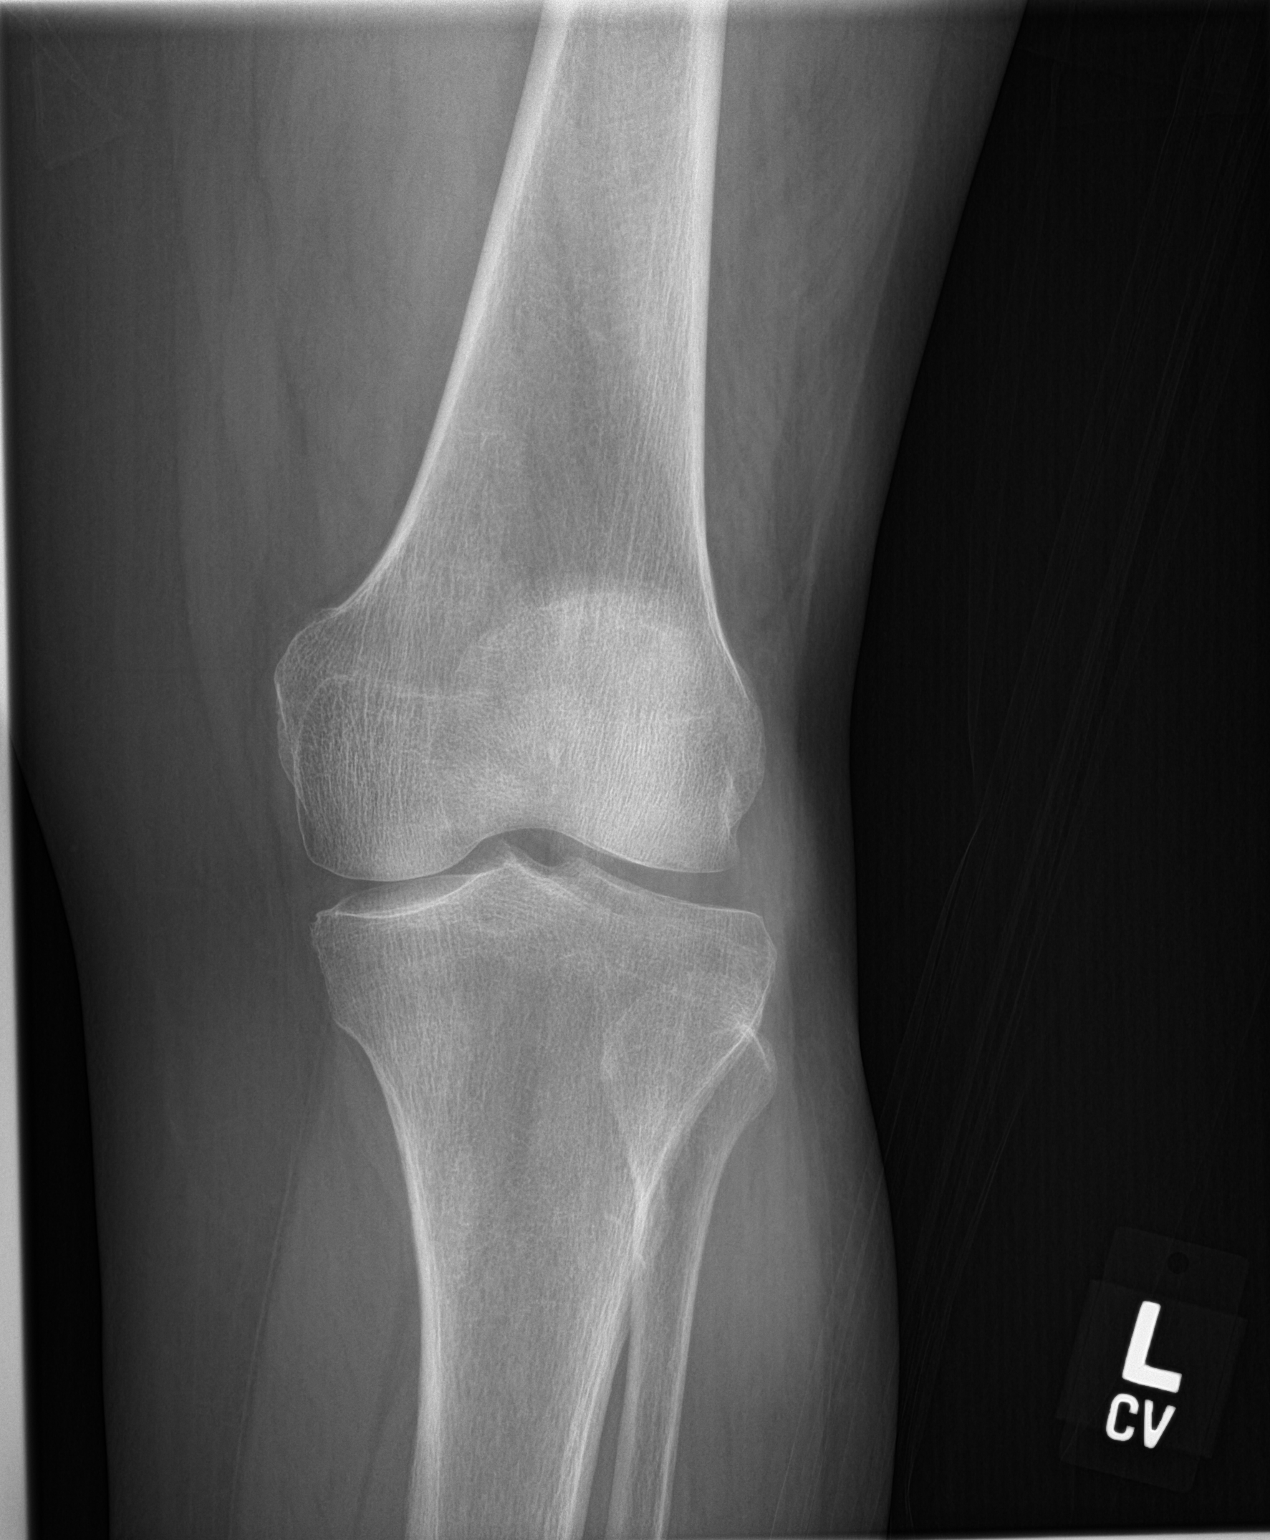
[im 2/2]
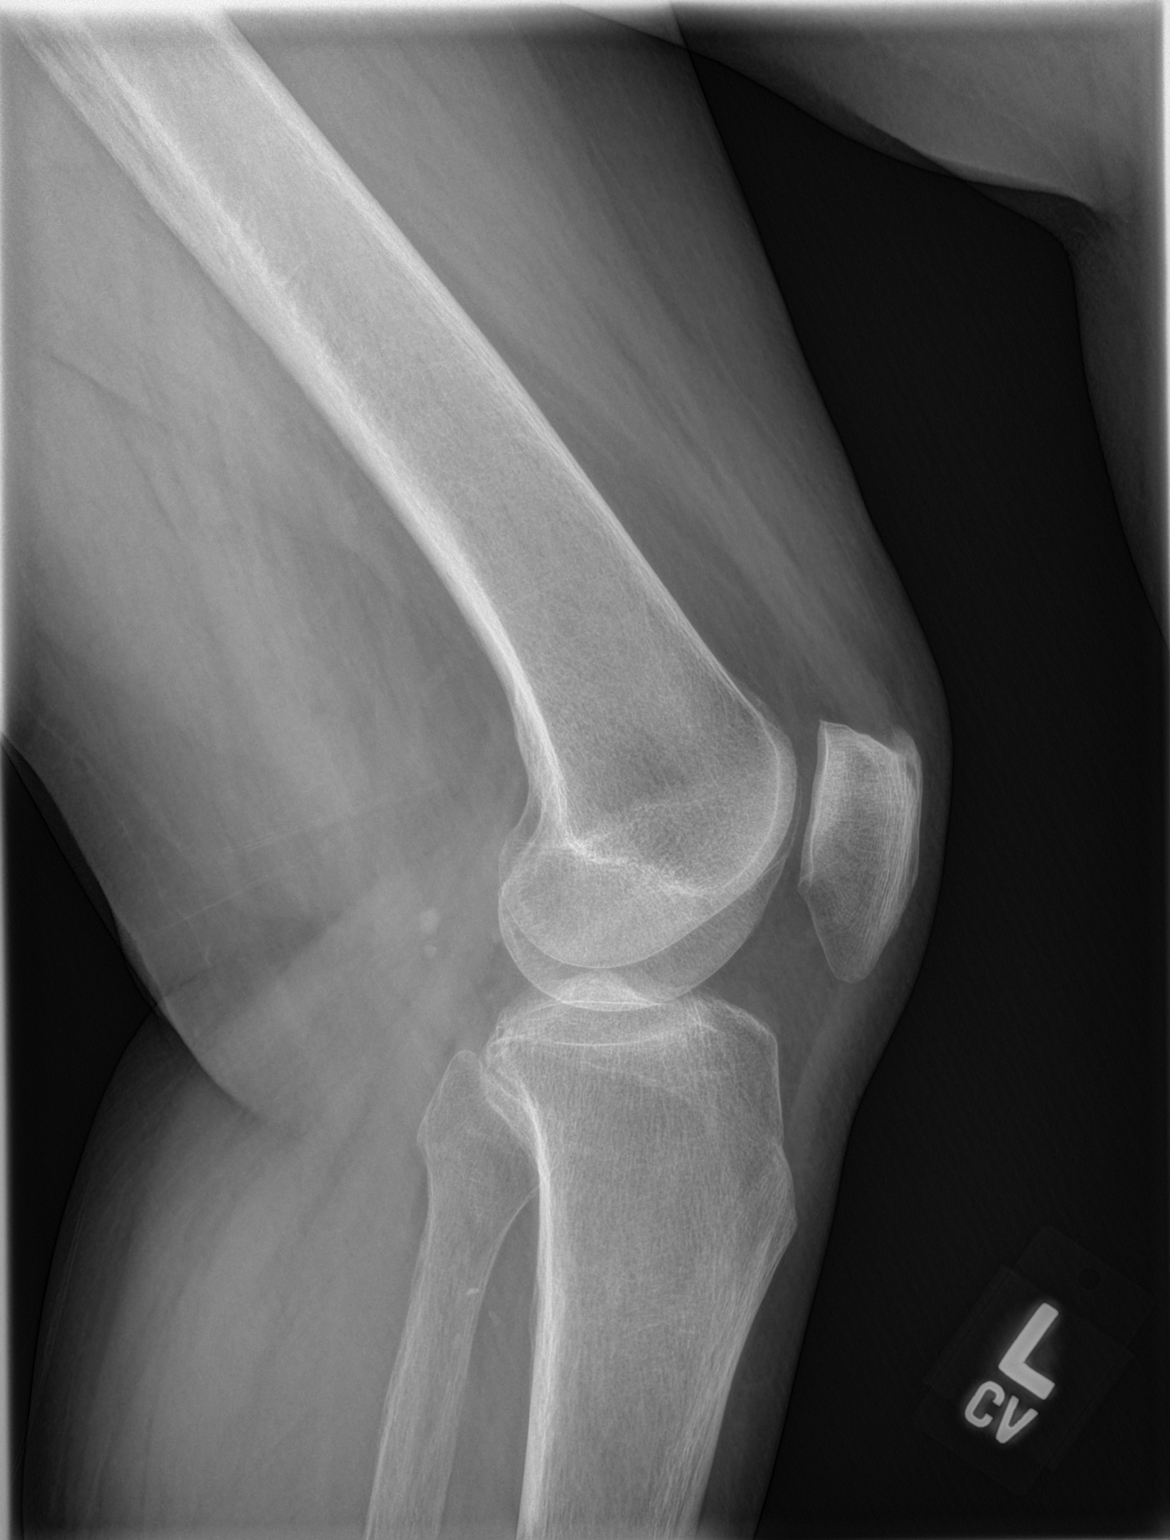

[2 of 2 positions shown; findings below may reference images not displayed]

FINDINGS: Normal alignment no fracture or effusion. Joint space is maintained
normally. Mild spurring superior pole of patella.
IMPRESSION: No acute abnormality.

## 2015-04-13 IMAGING — CR DG KNEE 1-2V*R*
1 series · 2 of 2 positions shown · non-contrast
Comparison: None.

CLINICAL DATA: Knee pain

EXAM:
RIGHT KNEE - 1-2 VIEW

[Series 1: kdxr knee right ap and lateral · 0.14mm/px · 2 of 2 slices shown]
[im 1/2]
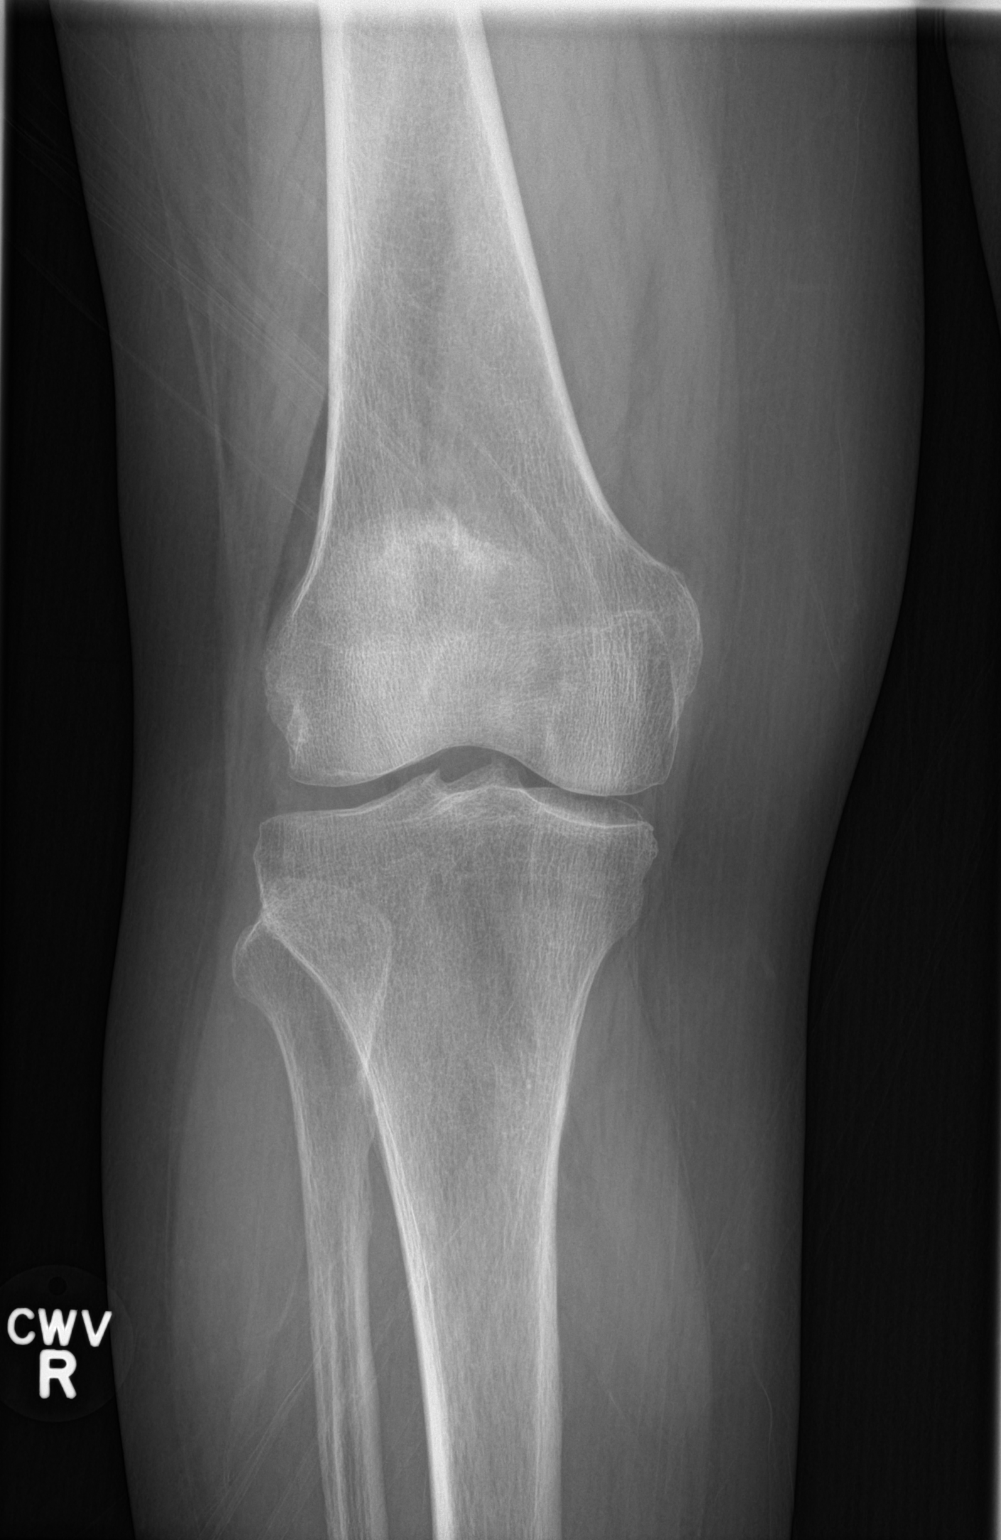
[im 2/2]
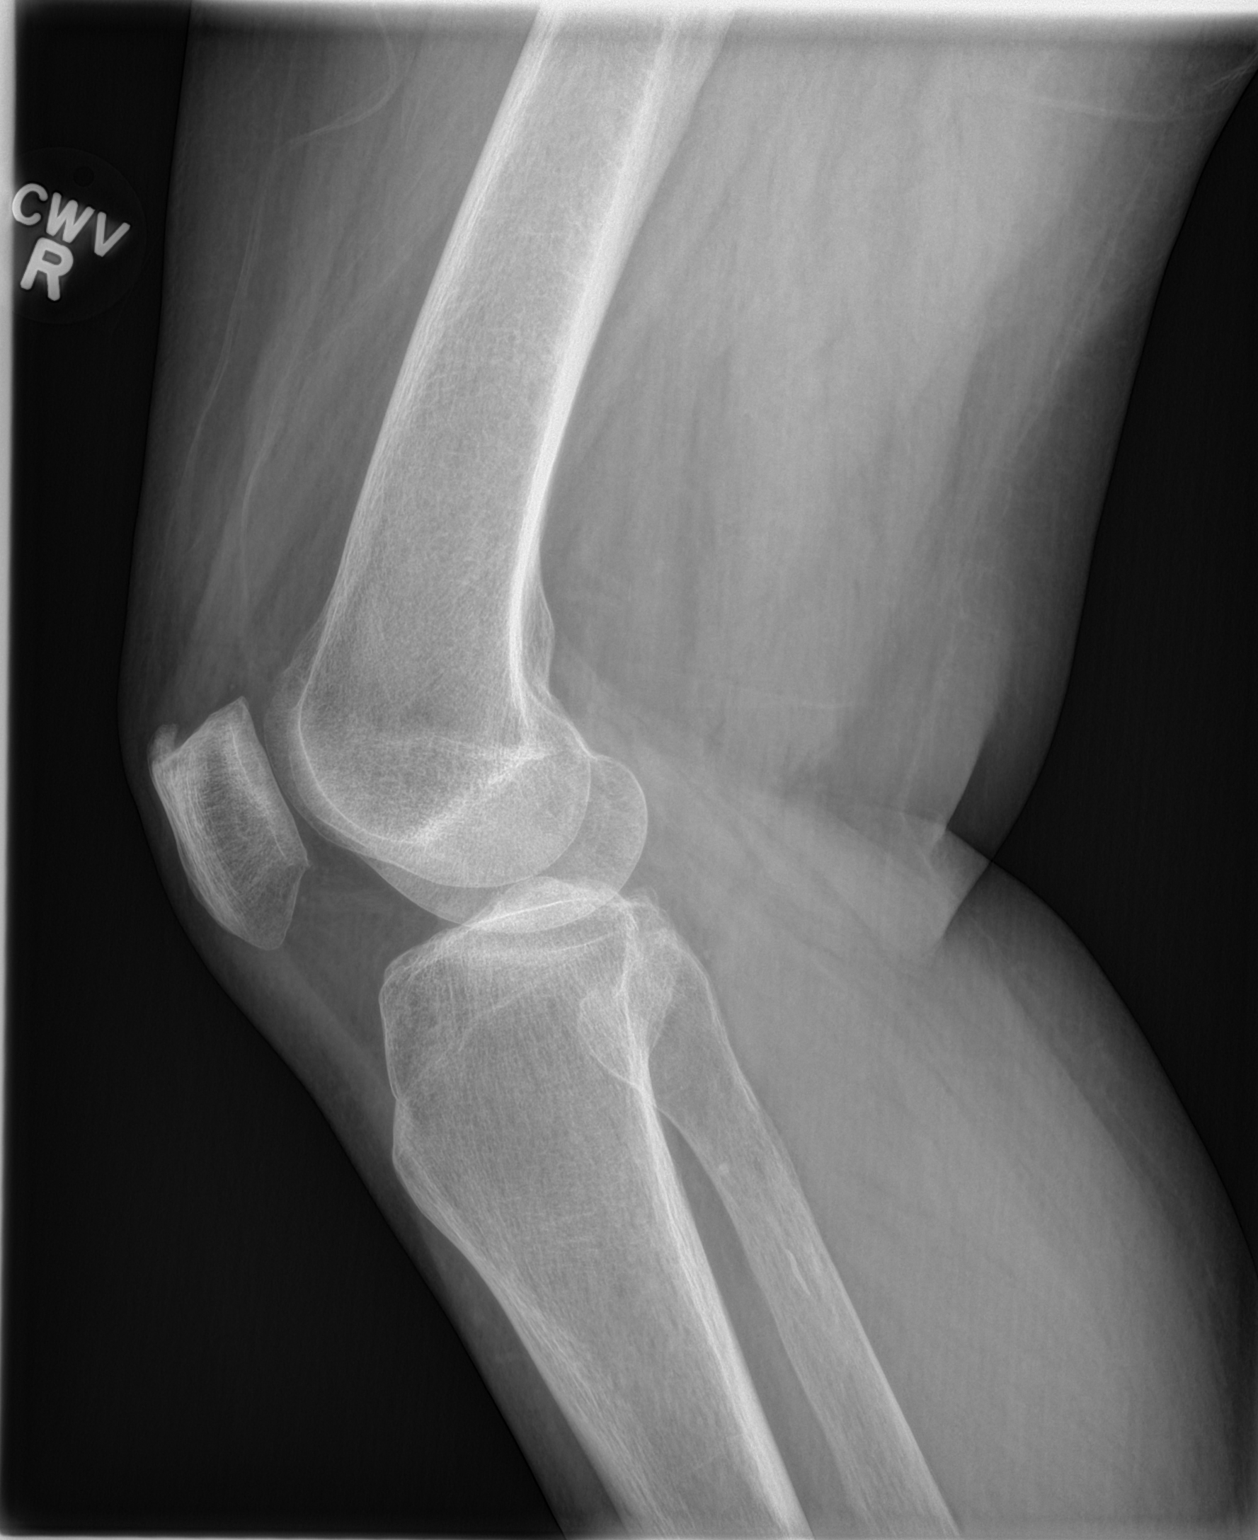

[2 of 2 positions shown; findings below may reference images not displayed]

FINDINGS: Normal alignment no fracture. Joint spaces are well maintained. Mild
spurring superior pole of the patella. Negative for effusion or
loose body.
IMPRESSION: No acute abnormality.

## 2016-06-05 ENCOUNTER — Encounter (INDEPENDENT_AMBULATORY_CARE_PROVIDER_SITE_OTHER): Payer: Self-pay | Admitting: *Deleted

## 2018-06-10 DEATH — deceased
